# Patient Record
Sex: Female | Born: 1970 | Race: White | Hispanic: No | Marital: Married | Smoking: Never smoker
Health system: Southern US, Community
[De-identification: ages and names within clinical notes are randomized; demographics above are authoritative.]

## PROBLEM LIST (undated history)

## (undated) HISTORY — PX: TONSILLECTOMY: SUR1361

---

## 1998-05-25 ENCOUNTER — Other Ambulatory Visit: Admission: RE | Admit: 1998-05-25 | Discharge: 1998-05-25 | Payer: Self-pay | Admitting: Family Medicine

## 2005-03-28 ENCOUNTER — Other Ambulatory Visit: Admission: RE | Admit: 2005-03-28 | Discharge: 2005-03-28 | Payer: Self-pay | Admitting: Obstetrics and Gynecology

## 2005-03-29 ENCOUNTER — Ambulatory Visit (HOSPITAL_COMMUNITY): Admission: RE | Admit: 2005-03-29 | Discharge: 2005-03-29 | Payer: Self-pay | Admitting: Obstetrics and Gynecology

## 2006-05-23 ENCOUNTER — Other Ambulatory Visit: Admission: RE | Admit: 2006-05-23 | Discharge: 2006-05-23 | Payer: Self-pay | Admitting: Obstetrics and Gynecology

## 2016-05-18 ENCOUNTER — Other Ambulatory Visit (HOSPITAL_COMMUNITY): Payer: Self-pay | Admitting: Obstetrics and Gynecology

## 2016-05-18 DIAGNOSIS — Z1231 Encounter for screening mammogram for malignant neoplasm of breast: Secondary | ICD-10-CM

## 2016-05-21 ENCOUNTER — Ambulatory Visit (HOSPITAL_COMMUNITY)
Admission: RE | Admit: 2016-05-21 | Discharge: 2016-05-21 | Disposition: A | Discharge status: Home / Self Care | Payer: BC Managed Care – PPO | Source: Ambulatory Visit | Attending: Obstetrics and Gynecology | Admitting: Obstetrics and Gynecology

## 2016-05-21 DIAGNOSIS — R928 Other abnormal and inconclusive findings on diagnostic imaging of breast: Secondary | ICD-10-CM | POA: Diagnosis not present

## 2016-05-21 DIAGNOSIS — Z1231 Encounter for screening mammogram for malignant neoplasm of breast: Secondary | ICD-10-CM | POA: Diagnosis not present

## 2016-05-28 ENCOUNTER — Other Ambulatory Visit: Payer: Self-pay | Admitting: Obstetrics and Gynecology

## 2016-05-28 DIAGNOSIS — R928 Other abnormal and inconclusive findings on diagnostic imaging of breast: Secondary | ICD-10-CM

## 2016-06-05 ENCOUNTER — Other Ambulatory Visit: Payer: BC Managed Care – PPO

## 2016-06-12 ENCOUNTER — Encounter (HOSPITAL_COMMUNITY): Payer: BC Managed Care – PPO

## 2016-06-26 ENCOUNTER — Encounter (HOSPITAL_COMMUNITY): Payer: BC Managed Care – PPO

## 2017-06-27 ENCOUNTER — Other Ambulatory Visit: Payer: Self-pay | Admitting: Family Medicine

## 2017-06-27 DIAGNOSIS — N6320 Unspecified lump in the left breast, unspecified quadrant: Secondary | ICD-10-CM

## 2017-07-02 ENCOUNTER — Other Ambulatory Visit: Payer: BC Managed Care – PPO

## 2017-07-05 ENCOUNTER — Ambulatory Visit
Admission: RE | Admit: 2017-07-05 | Discharge: 2017-07-05 | Disposition: A | Discharge status: Home / Self Care | Payer: BC Managed Care – PPO | Source: Ambulatory Visit | Attending: Family Medicine | Admitting: Family Medicine

## 2017-07-05 ENCOUNTER — Other Ambulatory Visit: Payer: Self-pay | Admitting: Family Medicine

## 2017-07-05 DIAGNOSIS — N6002 Solitary cyst of left breast: Secondary | ICD-10-CM

## 2017-07-05 DIAGNOSIS — N6320 Unspecified lump in the left breast, unspecified quadrant: Secondary | ICD-10-CM

## 2018-07-07 ENCOUNTER — Other Ambulatory Visit: Payer: Self-pay | Admitting: Family Medicine

## 2018-07-07 DIAGNOSIS — Z1231 Encounter for screening mammogram for malignant neoplasm of breast: Secondary | ICD-10-CM

## 2018-07-09 ENCOUNTER — Ambulatory Visit
Admission: RE | Admit: 2018-07-09 | Discharge: 2018-07-09 | Disposition: A | Discharge status: Home / Self Care | Payer: BC Managed Care – PPO | Source: Ambulatory Visit | Attending: Family Medicine | Admitting: Family Medicine

## 2018-07-09 DIAGNOSIS — Z1231 Encounter for screening mammogram for malignant neoplasm of breast: Secondary | ICD-10-CM

## 2019-10-21 ENCOUNTER — Other Ambulatory Visit: Payer: Self-pay | Admitting: Family Medicine

## 2019-10-21 DIAGNOSIS — Z1231 Encounter for screening mammogram for malignant neoplasm of breast: Secondary | ICD-10-CM

## 2019-12-15 ENCOUNTER — Ambulatory Visit
Admission: RE | Admit: 2019-12-15 | Discharge: 2019-12-15 | Disposition: A | Discharge status: Home / Self Care | Payer: BC Managed Care – PPO | Source: Ambulatory Visit | Attending: Family Medicine | Admitting: Family Medicine

## 2019-12-15 ENCOUNTER — Other Ambulatory Visit: Payer: Self-pay

## 2019-12-15 DIAGNOSIS — Z1231 Encounter for screening mammogram for malignant neoplasm of breast: Secondary | ICD-10-CM

## 2020-01-18 ENCOUNTER — Ambulatory Visit: Payer: BC Managed Care – PPO | Attending: Family

## 2020-01-18 DIAGNOSIS — Z23 Encounter for immunization: Secondary | ICD-10-CM | POA: Insufficient documentation

## 2020-01-18 NOTE — Progress Notes (Signed)
   Covid-19 Vaccination Clinic  Name:  Erin Houston    MRN: [id number] DOB: [birth year]  01/18/2020  [name] was observed post Covid-19 immunization for 15 minutes without incidence. She was provided with Vaccine Information Sheet and instruction to access the V-Safe system.   [name] was instructed to call 911 with any severe reactions post vaccine: Marland Kitchen Difficulty breathing  . Swelling of your face and throat  . A fast heartbeat  . A bad rash all over your body  . Dizziness and weakness    Immunizations Administered    Name Date Dose VIS Date Route   Moderna COVID-19 Vaccine 01/18/2020  5:05 PM 0.5 mL 10/27/2019 Intramuscular   Manufacturer: Moderna   Lot: [id number]   [place]:[id number]

## 2020-03-08 ENCOUNTER — Ambulatory Visit: Payer: BC Managed Care – PPO

## 2021-02-26 IMAGING — MG DIGITAL SCREENING BILAT W/ TOMO W/ CAD
8 series · 8 of 24 positions shown · non-contrast
Comparison: Previous exam(s).

CLINICAL DATA: Screening.

EXAM:
DIGITAL SCREENING BILATERAL MAMMOGRAM WITH TOMO AND CAD

[L MLO synth-2D]
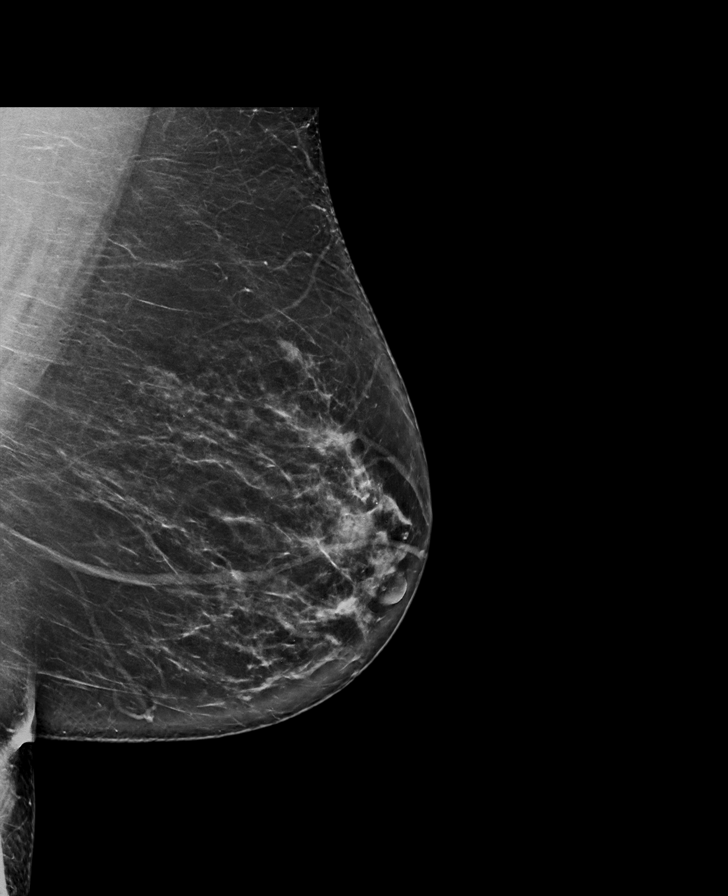

[R MLO synth-2D]
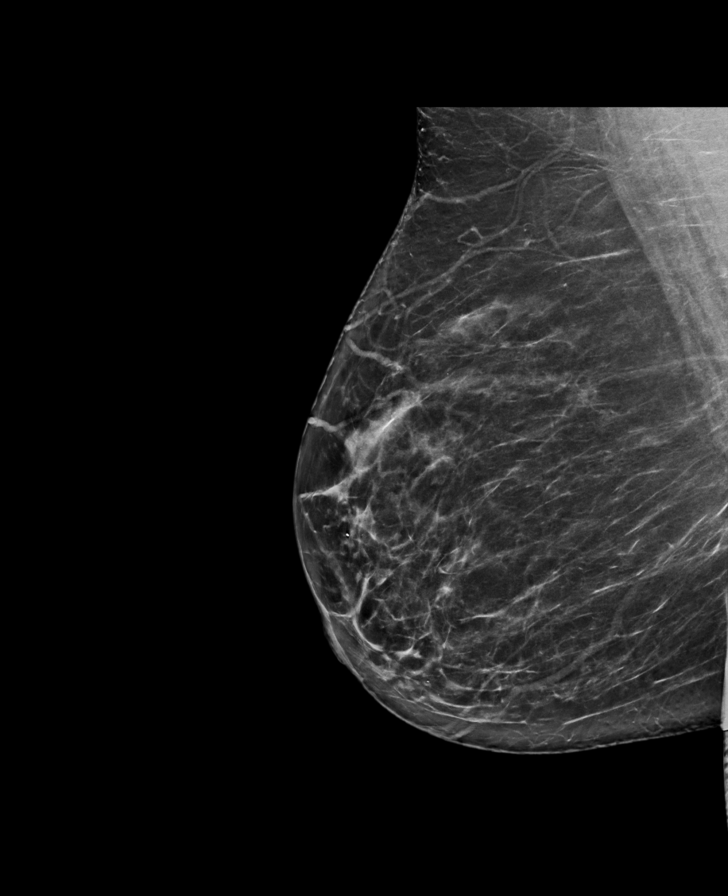

[R CC synth-2D]
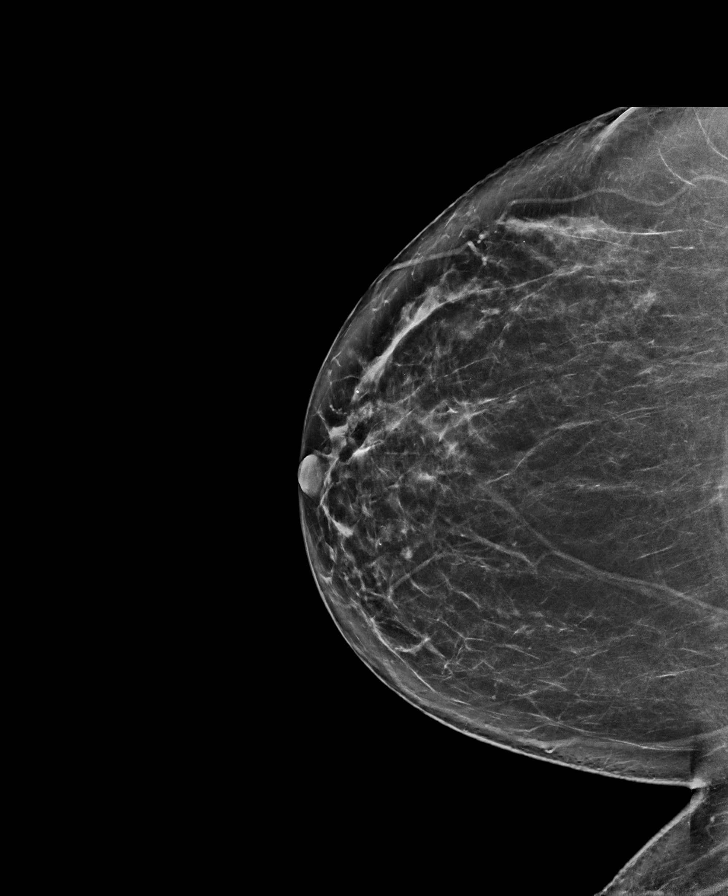

[L CC synth-2D]
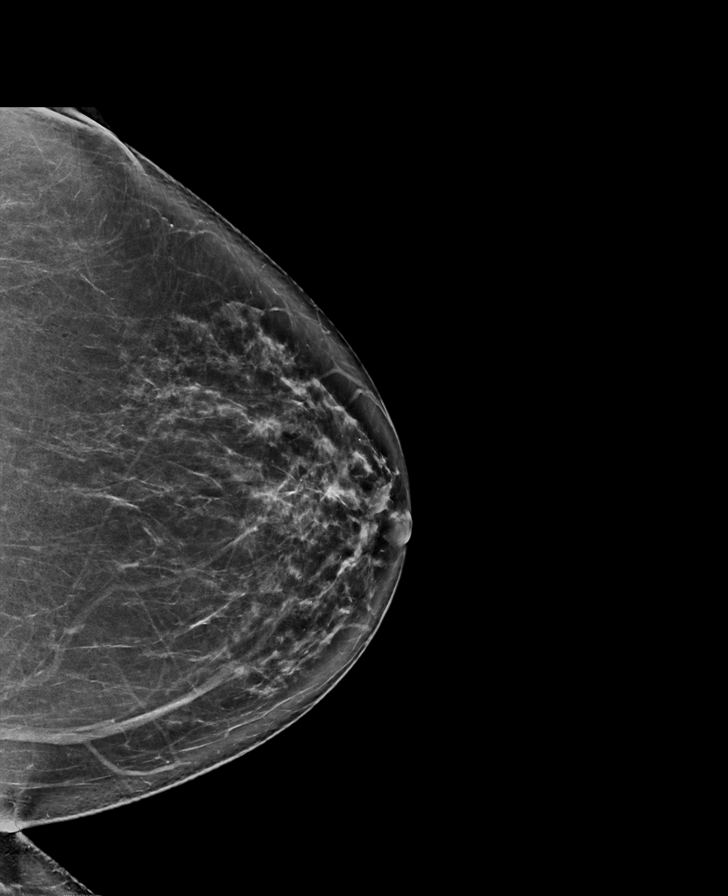

[L CC tomo · tomo slice 40/79.0]
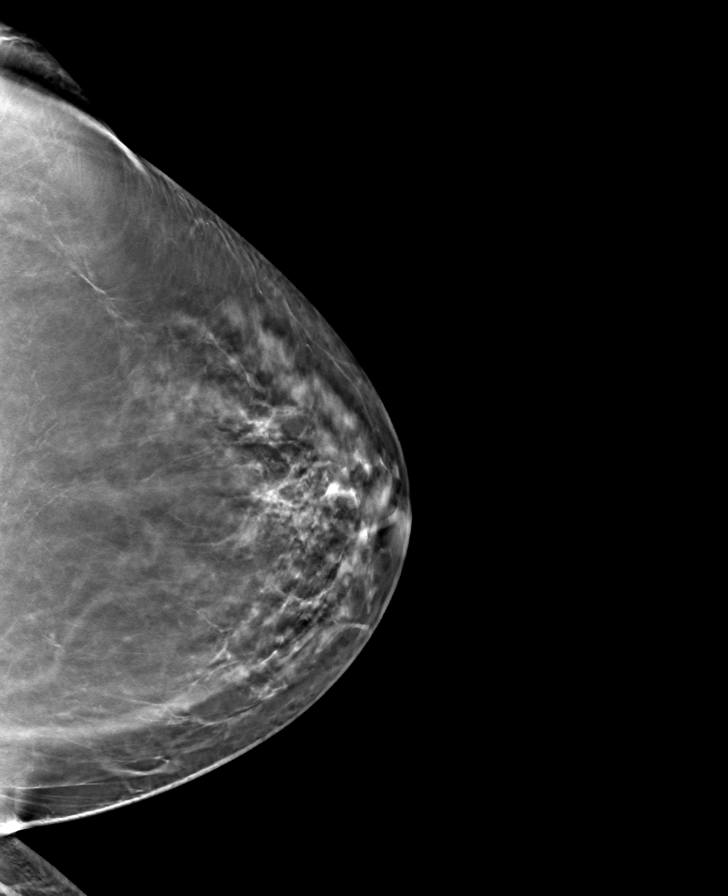

[R MLO tomo · tomo slice 41/81.0]
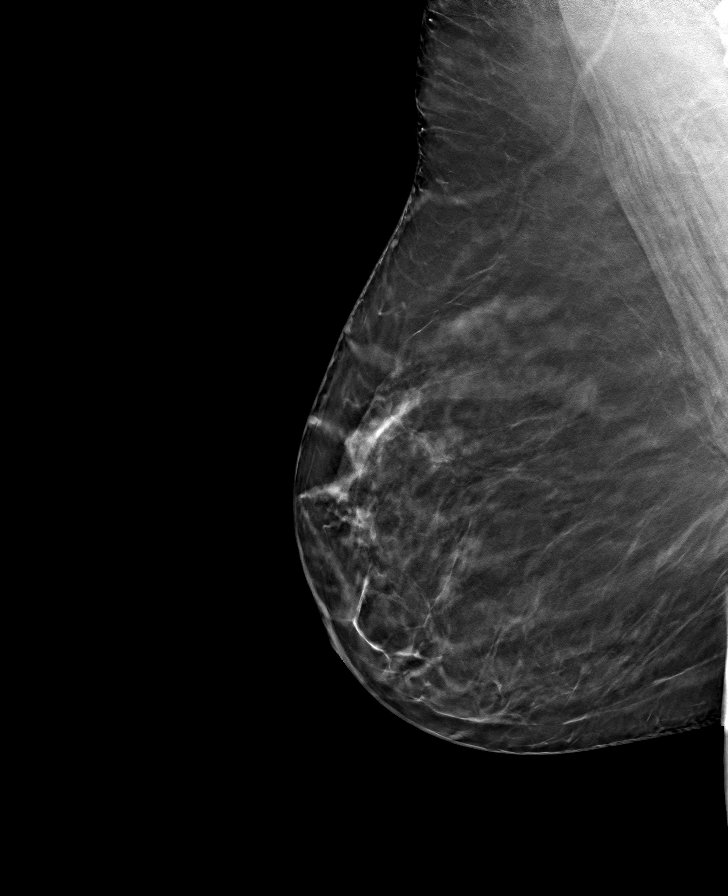

[L MLO tomo · tomo slice 41/82.0]
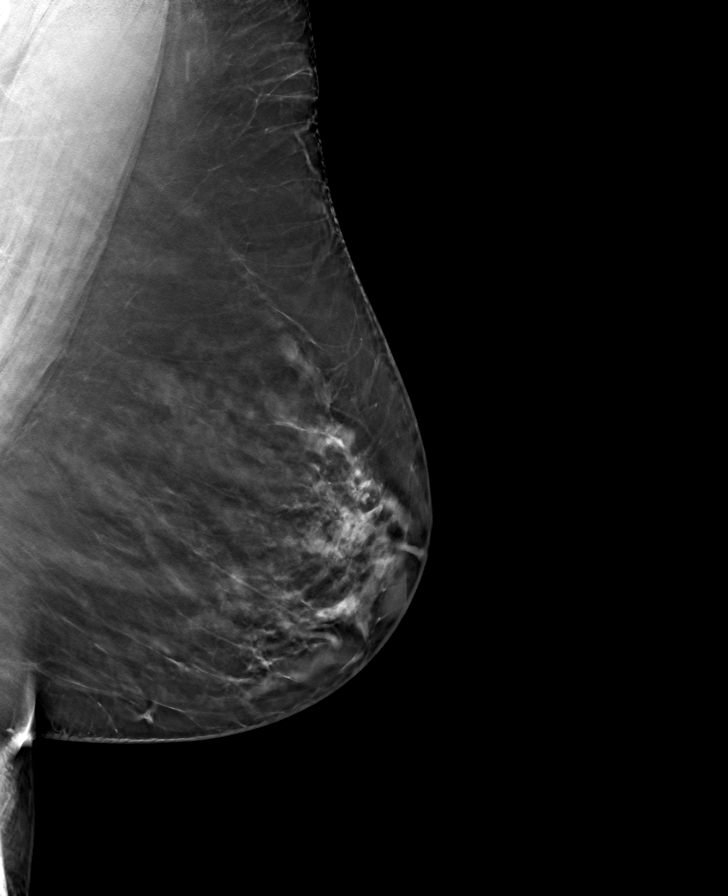

[R CC tomo · tomo slice 39/78.0]
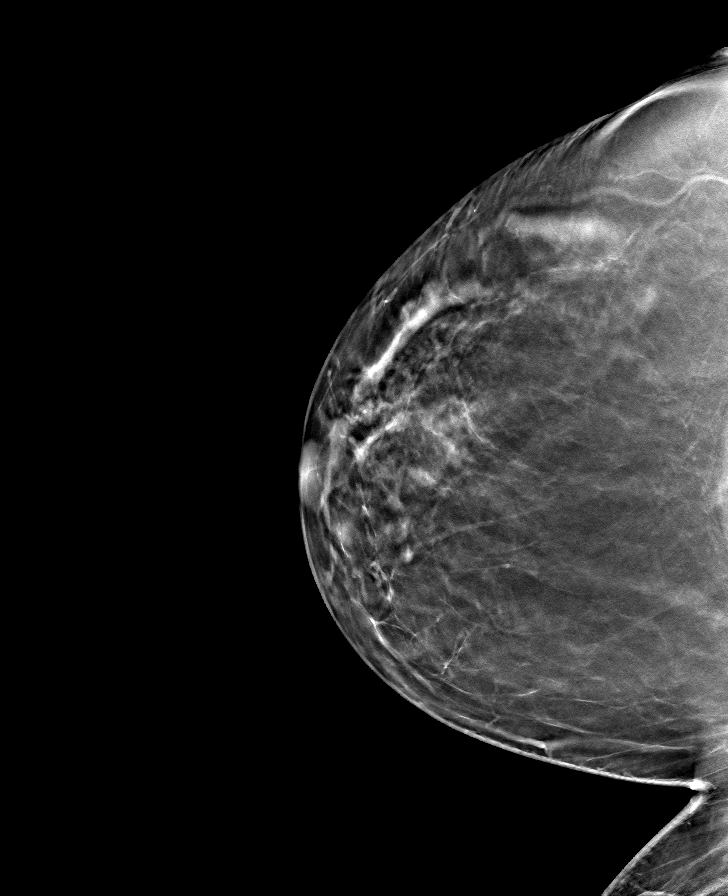

[8 of 24 positions shown; findings below may reference images not displayed]

ACR Breast Density Category b: There are scattered areas of
fibroglandular density.
FINDINGS: There are no findings suspicious for malignancy. Images were
processed with CAD.
IMPRESSION: No mammographic evidence of malignancy. A result letter of this
screening mammogram will be mailed directly to the patient.

RECOMMENDATION:
Screening mammogram in one year. (Code:[id number])

BI-RADS CATEGORY  1: Negative.

## 2021-10-04 ENCOUNTER — Inpatient Hospital Stay (HOSPITAL_BASED_OUTPATIENT_CLINIC_OR_DEPARTMENT_OTHER)
Admission: EM | Admit: 2021-10-04 | Discharge: 2021-10-06 | DRG: 391 | Disposition: A | Discharge status: Home / Self Care | Payer: BC Managed Care – PPO | Attending: Family Medicine | Admitting: Family Medicine

## 2021-10-04 ENCOUNTER — Encounter (HOSPITAL_BASED_OUTPATIENT_CLINIC_OR_DEPARTMENT_OTHER): Payer: Self-pay | Admitting: Obstetrics and Gynecology

## 2021-10-04 ENCOUNTER — Emergency Department (HOSPITAL_BASED_OUTPATIENT_CLINIC_OR_DEPARTMENT_OTHER): Payer: BC Managed Care – PPO

## 2021-10-04 ENCOUNTER — Other Ambulatory Visit: Payer: Self-pay

## 2021-10-04 DIAGNOSIS — Z20822 Contact with and (suspected) exposure to covid-19: Secondary | ICD-10-CM | POA: Diagnosis present

## 2021-10-04 DIAGNOSIS — K3533 Acute appendicitis with perforation and localized peritonitis, with abscess: Secondary | ICD-10-CM | POA: Diagnosis present

## 2021-10-04 DIAGNOSIS — R109 Unspecified abdominal pain: Secondary | ICD-10-CM | POA: Diagnosis present

## 2021-10-04 DIAGNOSIS — K523 Indeterminate colitis: Principal | ICD-10-CM | POA: Diagnosis present

## 2021-10-04 DIAGNOSIS — K649 Unspecified hemorrhoids: Secondary | ICD-10-CM | POA: Diagnosis present

## 2021-10-04 DIAGNOSIS — D122 Benign neoplasm of ascending colon: Secondary | ICD-10-CM | POA: Diagnosis not present

## 2021-10-04 DIAGNOSIS — K635 Polyp of colon: Secondary | ICD-10-CM | POA: Diagnosis present

## 2021-10-04 DIAGNOSIS — Z8379 Family history of other diseases of the digestive system: Secondary | ICD-10-CM | POA: Diagnosis not present

## 2021-10-04 DIAGNOSIS — R1033 Periumbilical pain: Secondary | ICD-10-CM

## 2021-10-04 DIAGNOSIS — R110 Nausea: Secondary | ICD-10-CM

## 2021-10-04 DIAGNOSIS — R933 Abnormal findings on diagnostic imaging of other parts of digestive tract: Secondary | ICD-10-CM

## 2021-10-04 DIAGNOSIS — D124 Benign neoplasm of descending colon: Secondary | ICD-10-CM | POA: Diagnosis not present

## 2021-10-04 DIAGNOSIS — R197 Diarrhea, unspecified: Secondary | ICD-10-CM

## 2021-10-04 LAB — COMPREHENSIVE METABOLIC PANEL
ALT: 14 U/L (ref 0–44)
AST: 15 U/L (ref 15–41)
Albumin: 3.9 g/dL (ref 3.5–5.0)
Alkaline Phosphatase: 59 U/L (ref 38–126)
Anion gap: 10 (ref 5–15)
BUN: 15 mg/dL (ref 6–20)
CO2: 27 mmol/L (ref 22–32)
Calcium: 9.8 mg/dL (ref 8.9–10.3)
Chloride: 99 mmol/L (ref 98–111)
Creatinine, Ser: 0.47 mg/dL (ref 0.44–1.00)
GFR, Estimated: >60 mL/min (ref >60)
Glucose, Bld: 92 mg/dL (ref 70–99)
Potassium: 3.8 mmol/L (ref 3.5–5.1)
Sodium: 136 mmol/L (ref 135–145)
Total Bilirubin: 0.5 mg/dL (ref 0.3–1.2)
Total Protein: 7.4 g/dL (ref 6.5–8.1)

## 2021-10-04 LAB — URINALYSIS, ROUTINE W REFLEX MICROSCOPIC
Bilirubin Urine: NEGATIVE
Glucose, UA: NEGATIVE mg/dL
Hgb urine dipstick: NEGATIVE
Ketones, ur: NEGATIVE mg/dL
Leukocytes,Ua: NEGATIVE
Nitrite: NEGATIVE
Protein, ur: NEGATIVE mg/dL
Specific Gravity, Urine: 1.011 (ref 1.005–1.030)
pH: 6 (ref 5.0–8.0)

## 2021-10-04 LAB — RESP PANEL BY RT-PCR (FLU A&B, COVID) ARPGX2
Influenza A by PCR: NEGATIVE
Influenza B by PCR: NEGATIVE
SARS Coronavirus 2 by RT PCR: NEGATIVE

## 2021-10-04 LAB — CBC
HCT: 39.3 % (ref 36.0–46.0)
Hemoglobin: 13.3 g/dL (ref 12.0–15.0)
MCH: 29 pg (ref 26.0–34.0)
MCHC: 33.8 g/dL (ref 30.0–36.0)
MCV: 85.6 fL (ref 80.0–100.0)
Platelets: 252 10*3/uL (ref 150–400)
RBC: 4.59 MIL/uL (ref 3.87–5.11)
RDW: 13.8 % (ref 11.5–15.5)
WBC: 10 10*3/uL (ref 4.0–10.5)
nRBC: 0 % (ref 0.0–0.2)

## 2021-10-04 LAB — LIPASE, BLOOD: Lipase: <10 U/L — ABNORMAL LOW (ref 11–51)

## 2021-10-04 LAB — PREGNANCY, URINE: Preg Test, Ur: NEGATIVE

## 2021-10-04 MED ORDER — MORPHINE SULFATE (PF) 2 MG/ML IV SOLN: 1.0000 mg | INTRAVENOUS | Status: DC | PRN

## 2021-10-04 MED ORDER — IOHEXOL 300 MG/ML  SOLN
100.0000 mL | Freq: Once | INTRAMUSCULAR | Status: AC | PRN
  Administered 2021-10-04: 100 mL via INTRAVENOUS

## 2021-10-04 MED ORDER — DICYCLOMINE HCL 10 MG PO CAPS
10.0000 mg | ORAL_CAPSULE | Freq: Once | ORAL | Status: AC
  Administered 2021-10-04: 10 mg via ORAL
  Filled 2021-10-04: qty 1

## 2021-10-04 MED ORDER — SODIUM CHLORIDE 0.9 % IV BOLUS
1000.0000 mL | Freq: Once | INTRAVENOUS | Status: AC
  Administered 2021-10-04: 1000 mL via INTRAVENOUS

## 2021-10-04 MED ORDER — ACETAMINOPHEN 650 MG RE SUPP: 650.0000 mg | Freq: Four times a day (QID) | RECTAL | Status: DC | PRN

## 2021-10-04 MED ORDER — ACETAMINOPHEN 325 MG PO TABS: 650.0000 mg | ORAL_TABLET | Freq: Four times a day (QID) | ORAL | Status: DC | PRN

## 2021-10-04 MED ORDER — DEXTROSE IN LACTATED RINGERS 5 % IV SOLN: INTRAVENOUS | Status: AC

## 2021-10-04 MED ORDER — PIPERACILLIN-TAZOBACTAM 3.375 G IVPB 30 MIN
3.3750 g | Freq: Three times a day (TID) | INTRAVENOUS | Status: DC
Start: 2021-10-05 — End: 2021-10-06
  Administered 2021-10-05 – 2021-10-06 (×5): 3.375 g via INTRAVENOUS
  Filled 2021-10-04 (×6): qty 50

## 2021-10-04 MED ORDER — PIPERACILLIN-TAZOBACTAM 3.375 G IVPB 30 MIN
3.3750 g | Freq: Once | INTRAVENOUS | Status: AC
  Administered 2021-10-04: 3.375 g via INTRAVENOUS
  Filled 2021-10-04: qty 50

## 2021-10-04 NOTE — ED Provider Notes (Signed)
[facility] EMERGENCY DEPT Provider Note   CSN: [id number] Arrival date & time: 10/04/21  1448     History Chief Complaint  Patient presents with   Abdominal Pain    Erin Houston is a 50 y.o. female.  Who presents to the emergency department with abdominal pain.  She states that beginning on Saturday night she started having abdominal discomfort and "spasming."  She describes the pain as constant, spasmy, and centered around her navel.  She also subjectively feels bloated and distended.  She had associated nausea, vomiting and diarrhea.  Denies blood in her vomit or diarrhea.  She states that she took some "gas medication" with minimal relief of symptoms.  She denies having excessive flatulence.  She initially had decreased p.o. intake but has been tolerating food and water as of last night.  She denies fevers, urinary symptoms, sick contacts.  She states that celiac disease runs in her family and she has been attempting to stay away from gluten.  She does not have a diagnosis of celiac disease.  She states that this is happened twice before and has had similar symptoms however they have not lasted this long.   Abdominal Pain Associated symptoms: diarrhea, nausea and vomiting   Associated symptoms: no dysuria and no fever       History reviewed. No pertinent past medical history.  There are no problems to display for this patient.   History reviewed. No pertinent surgical history.   OB History     Gravida      Para      Term      Preterm      AB      Living  1      SAB      IAB      Ectopic      Multiple      Live Births              No family history on file.  Social History   Tobacco Use   Smoking status: Never    Passive exposure: Never   Smokeless tobacco: Never  Vaping Use   Vaping Use: Never used  Substance Use Topics   Alcohol use: Not Currently   Drug use: Not Currently    Home Medications Prior to Admission  medications   Not on File    Allergies    Patient has no known allergies.  Review of Systems   Review of Systems  Constitutional:  Positive for appetite change. Negative for fever.  Gastrointestinal:  Positive for abdominal pain, diarrhea, nausea and vomiting. Negative for blood in stool.  Genitourinary:  Negative for dysuria.  All other systems reviewed and are negative.  Physical Exam Updated Vital Signs BP 116/77 (BP Location: Right Arm)   Pulse (!) 113   Temp 98.9 F (37.2 C)   Resp 18   Ht 5\' 6"  (1.676 m)   Wt 81.2 kg   LMP 02/28/2021   SpO2 98%   BMI 28.89 kg/m   Physical Exam Vitals and nursing note reviewed.  Constitutional:      General: She is not in acute distress.    Appearance: Normal appearance. She is well-developed. She is not ill-appearing or toxic-appearing.  HENT:     Head: Normocephalic and atraumatic.     Mouth/Throat:     Mouth: Mucous membranes are moist.  Eyes:     General: No scleral icterus.    Extraocular Movements: Extraocular movements intact.  Cardiovascular:     Rate and Rhythm: Normal rate and regular rhythm.     Heart sounds: Normal heart sounds. No murmur heard. Pulmonary:     Effort: Pulmonary effort is normal. No respiratory distress.     Breath sounds: Normal breath sounds.  Abdominal:     General: Abdomen is protuberant. Bowel sounds are normal.     Palpations: Abdomen is soft.     Tenderness: There is abdominal tenderness in the periumbilical area. There is no right CVA tenderness, left CVA tenderness or rebound. Negative signs include Murphy's sign and McBurney's sign.     Comments: She endorses distention however there is no obvious abdominal distention or rigidity  Skin:    General: Skin is warm and dry.     Capillary Refill: Capillary refill takes less than 2 seconds.  Neurological:     General: No focal deficit present.     Mental Status: She is alert and oriented to person, place, and time.  Psychiatric:         Mood and Affect: Mood normal.        Behavior: Behavior normal.    ED Results / Procedures / Treatments   Labs (all labs ordered are listed, but only abnormal results are displayed) Labs Reviewed  LIPASE, BLOOD - Abnormal; Notable for the following components:      Result Value   Lipase <10 (*)    All other components within normal limits  COMPREHENSIVE METABOLIC PANEL  CBC  URINALYSIS, ROUTINE W REFLEX MICROSCOPIC  PREGNANCY, URINE   EKG None  Radiology CT Abdomen Pelvis W Contrast  Result Date: 10/04/2021 CLINICAL DATA:  Abdominal distension, severe diffuse abdominal pain and gas like feelings, thinks she has an undiagnosed gluten sensitivity and had a donut Saturday night EXAM: CT ABDOMEN AND PELVIS WITH CONTRAST TECHNIQUE: Multidetector CT imaging of the abdomen and pelvis was performed using the standard protocol following bolus administration of intravenous contrast. CONTRAST:  118mL OMNIPAQUE IOHEXOL 300 MG/ML SOLN IV. No oral contrast. COMPARISON:  None FINDINGS: Lower chest: Lung bases clear Hepatobiliary: Gallbladder and liver normal appearance Pancreas: Normal appearance Spleen: Normal appearance Adrenals/Urinary Tract: Adrenal glands, kidneys, ureters, and bladder normal appearance Stomach/Bowel: Appendix enlarged, 9 mm thick though mildly tapering distally. Extensive inflammatory process identified in the RIGHT pelvis with diffuse infiltration of mesenteric planes, thickening of cecum, periappendiceal infiltration, and thickening of distal small bowel loops. Associated free fluid in pelvis and mesentery. Dilated proximal small bowel loops some which show upper normal wall thickness. Colon decompressed. Stomach unremarkable. Ingested radiopacities within colon. Vascular/Lymphatic: Vascular structures patent. Aorta normal caliber. Scattered normal size mesenteric lymph nodes. Reproductive: Unremarkable uterus and adnexa Other: Free fluid in pelvis.  No free air.  No hernia or  abscess. Musculoskeletal: No acute osseous findings. IMPRESSION: Extensive inflammatory process in the RIGHT pelvis with diffuse infiltration of mesenteric planes, thickening of cecum, and distal small bowel loops, associated with free fluid in pelvis and mesentery. The appendix is mildly enlarged and does demonstrate a degree of peri-appendiceal infiltration, though it is uncertain whether this is the epicenter of the process or secondarily involved. Findings favor acute appendicitis with associated peritonitis and wall thickening of small bowel loops in the pelvis versus less likely diffuse enteritis of the distal small bowel loops such as from inflammatory bowel disease or infectious etiology with associated regional inflammatory changes. Secondary ileus with dilatation of proximal small bowel. Free fluid without free air or definite abscess. Electronically Signed   By: Elta Guadeloupe  Thornton Papas M.D.   On: 10/04/2021 17:36    Procedures Procedures   Medications Ordered in ED Medications  dicyclomine (BENTYL) capsule 10 mg (10 mg Oral Given 10/04/21 1632)  iohexol (OMNIPAQUE) 300 MG/ML solution 100 mL (100 mLs Intravenous Contrast Given 10/04/21 1708)  piperacillin-tazobactam (ZOSYN) IVPB 3.375 g (0 g Intravenous Stopped 10/04/21 1839)  sodium chloride 0.9 % bolus 1,000 mL (1,000 mLs Intravenous New Bag/Given 10/04/21 1809)   ED Course  I have reviewed the triage vital signs and the nursing notes.  Pertinent labs & imaging results that were available during my care of the patient were reviewed by me and considered in my medical decision making (see chart for details).    MDM Rules/Calculators/A&P 50 year old female who presents emergency department abdominal pain.  Labs unremarkable  Given Bentyl for symptomatic relief of symptoms  CT A/P demonstrates extensive inflammatory process in right pelvis. Findings favoring acute appendicitis with peritonitis and wall thickening of small bowel loops.  -Spoke with  Dr. Dema Severin with general surgery who requested the patient be ED to ED transfer to [facility] so that she can be evaluated by the surgery team.  Otherwise she still needs to be admitted for antibiotics and observation.  We will can coordinate this. -Empirically started on Zosyn given 1 L fluid bolus.  Abdominal exam without peritoneal signs. No evidence of acute abdomen at this time. Well appearing.  She is stable to be transferred over to [facility] ED. Spoke with Dr. Doren Custard, ED physician at [facility] ED who accepts the patient. Final Clinical Impression(s) / ED Diagnoses Final diagnoses:  Periumbilical abdominal pain    Rx / DC Orders ED Discharge Orders     None        Mickie Hillier, PA-C 10/04/21 1909    Gareth Morgan, MD 10/06/21 1353

## 2021-10-04 NOTE — Consult Note (Signed)
CC: RLQ pain  Requesting provider: Dr. Deno Etienne  HPI: Erin Houston is an 50 y.o. female with hx of ?gluten sensitivity whom presented to [facility] with a now 5-day history of right lower quadrant abdominal pain.  She reports this pain began Friday or Saturday and has been a vague crampy/bloating sensation.  No intense or severe right lower quadrant pain per se.  She denies any history of fevers or emesis.  She has had intermittent nausea.  Today, she reports she had a great appetite and had a large lunch to eat.  She has been having diarrhea this entire time.  She denies any blood in her stool.  She denies any weight changes.  Her symptoms do not seem to be helped or worsened by any particular triggers.  She does report that over the last year she has had 2 other attacks of this same kind of constellation of symptoms.  She reports that she has tried to get in multiple times to see [facility] as she has been concerned about her symptoms.  She reports that she was told she needed a referral from her primary care before she could be seen.   Currently, reports mild right lower quadrant pain.    No prior colonoscopy  No known hx of IBD; multiple family members with Celiac  She works for [employer] as a Pharmacist, hospital in the gifted student program  History reviewed. No pertinent past medical history.  History reviewed. No pertinent surgical history.  No family history on file.  Social:  reports that she has never smoked. She has never been exposed to tobacco smoke. She has never used smokeless tobacco. She reports that she does not currently use alcohol. She reports that she does not currently use drugs.  Allergies: No Known Allergies  Medications: I have reviewed the patient's current medications.  Results for orders placed or performed during the hospital encounter of 10/04/21 (from the past 48 hour(s))  Urinalysis, Routine w reflex microscopic Urine, Clean Catch      Status: None   Collection Time: 10/04/21  3:29 PM  Result Value Ref Range   Color, Urine YELLOW YELLOW   APPearance CLEAR CLEAR   Specific Gravity, Urine 1.011 1.005 - 1.030   pH 6.0 5.0 - 8.0   Glucose, UA NEGATIVE NEGATIVE mg/dL   Hgb urine dipstick NEGATIVE NEGATIVE   Bilirubin Urine NEGATIVE NEGATIVE   Ketones, ur NEGATIVE NEGATIVE mg/dL   Protein, ur NEGATIVE NEGATIVE mg/dL   Nitrite NEGATIVE NEGATIVE   Leukocytes,Ua NEGATIVE NEGATIVE    Comment: Performed at [facility], [address], [place], [place] [address]  Pregnancy, urine     Status: None   Collection Time: 10/04/21  3:29 PM  Result Value Ref Range   Preg Test, Ur NEGATIVE NEGATIVE    Comment:        THE SENSITIVITY OF THIS METHODOLOGY IS >20 mIU/mL. Performed at [facility], [address] [place], [place] [address]   Lipase, blood     Status: Abnormal   Collection Time: 10/04/21  3:54 PM  Result Value Ref Range   Lipase <10 (L) 11 - 51 U/L    Comment: Performed at [facility], [address], [place], [place] [address]  Comprehensive metabolic panel     Status: None   Collection Time: 10/04/21  3:54 PM  Result Value Ref Range   Sodium 136 135 - 145 mmol/L   Potassium  3.8 3.5 - 5.1 mmol/L   Chloride 99 98 - 111 mmol/L   CO2 27 22 - 32 mmol/L   Glucose, Bld 92 70 - 99 mg/dL    Comment: Glucose reference range applies only to samples taken after fasting for at least 8 hours.   BUN 15 6 - 20 mg/dL   Creatinine, Ser 0.47 0.44 - 1.00 mg/dL   Calcium 9.8 8.9 - 10.3 mg/dL   Total Protein 7.4 6.5 - 8.1 g/dL   Albumin 3.9 3.5 - 5.0 g/dL   AST 15 15 - 41 U/L   ALT 14 0 - 44 U/L   Alkaline Phosphatase 59 38 - 126 U/L   Total Bilirubin 0.5 0.3 - 1.2 mg/dL   GFR, Estimated >60 >60 mL/min    Comment: (NOTE) Calculated using the CKD-EPI Creatinine Equation (2021)    Anion gap 10 5 - 15    Comment: Performed at [facility], [address] [place], [place] [address]  CBC     Status: None   Collection Time: 10/04/21  3:54 PM  Result Value Ref Range   WBC 10.0 4.0 - 10.5 K/uL   RBC 4.59 3.87 - 5.11 MIL/uL   Hemoglobin 13.3 12.0 - 15.0 g/dL   HCT 39.3 36.0 - 46.0 %   MCV 85.6 80.0 - 100.0 fL   MCH 29.0 26.0 - 34.0 pg   MCHC 33.8 30.0 - 36.0 g/dL   RDW 13.8 11.5 - 15.5 %   Platelets 252 150 - 400 K/uL   nRBC 0.0 0.0 - 0.2 %    Comment: Performed at [facility], [address], [place], [place] [address]    CT Abdomen Pelvis W Contrast  Result Date: 10/04/2021 CLINICAL DATA:  Abdominal distension, severe diffuse abdominal pain and gas like feelings, thinks she has an undiagnosed gluten sensitivity and had a donut Saturday night EXAM: CT ABDOMEN AND PELVIS WITH CONTRAST TECHNIQUE: Multidetector CT imaging of the abdomen and pelvis was performed using the standard protocol following bolus administration of intravenous contrast. CONTRAST:  164mL OMNIPAQUE IOHEXOL 300 MG/ML SOLN IV. No oral contrast. COMPARISON:  None FINDINGS: Lower chest: Lung bases clear Hepatobiliary: Gallbladder and liver normal appearance Pancreas: Normal appearance Spleen: Normal appearance Adrenals/Urinary Tract: Adrenal glands, kidneys, ureters, and bladder normal appearance Stomach/Bowel: Appendix enlarged, 9 mm thick though mildly tapering distally. Extensive inflammatory process identified in the RIGHT pelvis with diffuse infiltration of mesenteric planes, thickening of cecum, periappendiceal infiltration, and thickening of distal small bowel loops. Associated free fluid in pelvis and mesentery. Dilated proximal small bowel loops some which show upper normal wall thickness. Colon decompressed. Stomach unremarkable. Ingested radiopacities within colon. Vascular/Lymphatic: Vascular structures patent. Aorta normal caliber. Scattered normal size mesenteric lymph nodes. Reproductive: Unremarkable uterus and adnexa Other: Free fluid  in pelvis.  No free air.  No hernia or abscess. Musculoskeletal: No acute osseous findings. IMPRESSION: Extensive inflammatory process in the RIGHT pelvis with diffuse infiltration of mesenteric planes, thickening of cecum, and distal small bowel loops, associated with free fluid in pelvis and mesentery. The appendix is mildly enlarged and does demonstrate a degree of peri-appendiceal infiltration, though it is uncertain whether this is the epicenter of the process or secondarily involved. Findings favor acute appendicitis with associated peritonitis and wall thickening of small bowel loops in the pelvis versus less likely diffuse enteritis of the distal small bowel loops such as from inflammatory bowel disease or infectious etiology with associated regional inflammatory changes. Secondary ileus with  dilatation of proximal small bowel. Free fluid without free air or definite abscess. Electronically Signed   By: Lavonia Dana M.D.   On: 10/04/2021 17:36    ROS - all of the below systems have been reviewed with the patient and positives are indicated with bold text General: chills, fever or night sweats Eyes: blurry vision or double vision ENT: epistaxis or sore throat Allergy/Immunology: itchy/watery eyes or nasal congestion Hematologic/Lymphatic: bleeding problems, blood clots or swollen lymph nodes Endocrine: temperature intolerance or unexpected weight changes Breast: new or changing breast lumps or nipple discharge Resp: cough, shortness of breath, or wheezing CV: chest pain or dyspnea on exertion GI: as per HPI GU: dysuria, trouble voiding, or hematuria MSK: joint pain or joint stiffness Neuro: TIA or stroke symptoms Derm: pruritus and skin lesion changes Psych: anxiety and depression  PE Blood pressure 125/79, pulse 94, temperature 98.2 F (36.8 C), temperature source Oral, resp. rate 14, height 5\' 6"  (1.676 m), weight 81.2 kg, last menstrual period 02/28/2021, SpO2 98 %. Constitutional:  NAD; conversant; no deformities; clinically well appearing Eyes: Moist conjunctiva; no lid lag; anicteric; PERRL Neck: Trachea midline; no thyromegaly Lungs: Normal respiratory effort; no tactile fremitus CV: RRR; no palpable thrills; no pitting edema GI: Abdomen is soft, minimal RLQ tenderness, no rebound nor guarding, nondistended; no palpable hepatosplenomegaly MSK: Normal range of motion of extremities; no clubbing/cyanosis Psychiatric: Appropriate affect; alert and oriented x3 Lymphatic: No palpable cervical or axillary lymphadenopathy  Results for orders placed or performed during the hospital encounter of 10/04/21 (from the past 48 hour(s))  Urinalysis, Routine w reflex microscopic Urine, Clean Catch     Status: None   Collection Time: 10/04/21  3:29 PM  Result Value Ref Range   Color, Urine YELLOW YELLOW   APPearance CLEAR CLEAR   Specific Gravity, Urine 1.011 1.005 - 1.030   pH 6.0 5.0 - 8.0   Glucose, UA NEGATIVE NEGATIVE mg/dL   Hgb urine dipstick NEGATIVE NEGATIVE   Bilirubin Urine NEGATIVE NEGATIVE   Ketones, ur NEGATIVE NEGATIVE mg/dL   Protein, ur NEGATIVE NEGATIVE mg/dL   Nitrite NEGATIVE NEGATIVE   Leukocytes,Ua NEGATIVE NEGATIVE    Comment: Performed at [facility], [address] [place], [place] [address]  Pregnancy, urine     Status: None   Collection Time: 10/04/21  3:29 PM  Result Value Ref Range   Preg Test, Ur NEGATIVE NEGATIVE    Comment:        THE SENSITIVITY OF THIS METHODOLOGY IS >20 mIU/mL. Performed at [facility], [address] [place], [place] [address]   Lipase, blood     Status: Abnormal   Collection Time: 10/04/21  3:54 PM  Result Value Ref Range   Lipase <10 (L) 11 - 51 U/L    Comment: Performed at [facility], [address], [place], [place] [address]  Comprehensive metabolic panel     Status: None   Collection Time: 10/04/21  3:54 PM  Result Value Ref Range   Sodium  136 135 - 145 mmol/L   Potassium 3.8 3.5 - 5.1 mmol/L   Chloride 99 98 - 111 mmol/L   CO2 27 22 - 32 mmol/L   Glucose, Bld 92 70 - 99 mg/dL    Comment: Glucose reference range applies only to samples taken after fasting for at least 8 hours.   BUN 15 6 - 20 mg/dL   Creatinine, Ser 0.47 0.44 - 1.00 mg/dL   Calcium 9.8 8.9 - 10.3  mg/dL   Total Protein 7.4 6.5 - 8.1 g/dL   Albumin 3.9 3.5 - 5.0 g/dL   AST 15 15 - 41 U/L   ALT 14 0 - 44 U/L   Alkaline Phosphatase 59 38 - 126 U/L   Total Bilirubin 0.5 0.3 - 1.2 mg/dL   GFR, Estimated >60 >60 mL/min    Comment: (NOTE) Calculated using the CKD-EPI Creatinine Equation (2021)    Anion gap 10 5 - 15    Comment: Performed at [facility], [address] [place], [place] [address]  CBC     Status: None   Collection Time: 10/04/21  3:54 PM  Result Value Ref Range   WBC 10.0 4.0 - 10.5 K/uL   RBC 4.59 3.87 - 5.11 MIL/uL   Hemoglobin 13.3 12.0 - 15.0 g/dL   HCT 39.3 36.0 - 46.0 %   MCV 85.6 80.0 - 100.0 fL   MCH 29.0 26.0 - 34.0 pg   MCHC 33.8 30.0 - 36.0 g/dL   RDW 13.8 11.5 - 15.5 %   Platelets 252 150 - 400 K/uL   nRBC 0.0 0.0 - 0.2 %    Comment: Performed at [facility], [address], [place], [place] [address]    CT Abdomen Pelvis W Contrast  Result Date: 10/04/2021 CLINICAL DATA:  Abdominal distension, severe diffuse abdominal pain and gas like feelings, thinks she has an undiagnosed gluten sensitivity and had a donut Saturday night EXAM: CT ABDOMEN AND PELVIS WITH CONTRAST TECHNIQUE: Multidetector CT imaging of the abdomen and pelvis was performed using the standard protocol following bolus administration of intravenous contrast. CONTRAST:  148mL OMNIPAQUE IOHEXOL 300 MG/ML SOLN IV. No oral contrast. COMPARISON:  None FINDINGS: Lower chest: Lung bases clear Hepatobiliary: Gallbladder and liver normal appearance Pancreas: Normal appearance Spleen: Normal appearance Adrenals/Urinary Tract:  Adrenal glands, kidneys, ureters, and bladder normal appearance Stomach/Bowel: Appendix enlarged, 9 mm thick though mildly tapering distally. Extensive inflammatory process identified in the RIGHT pelvis with diffuse infiltration of mesenteric planes, thickening of cecum, periappendiceal infiltration, and thickening of distal small bowel loops. Associated free fluid in pelvis and mesentery. Dilated proximal small bowel loops some which show upper normal wall thickness. Colon decompressed. Stomach unremarkable. Ingested radiopacities within colon. Vascular/Lymphatic: Vascular structures patent. Aorta normal caliber. Scattered normal size mesenteric lymph nodes. Reproductive: Unremarkable uterus and adnexa Other: Free fluid in pelvis.  No free air.  No hernia or abscess. Musculoskeletal: No acute osseous findings. IMPRESSION: Extensive inflammatory process in the RIGHT pelvis with diffuse infiltration of mesenteric planes, thickening of cecum, and distal small bowel loops, associated with free fluid in pelvis and mesentery. The appendix is mildly enlarged and does demonstrate a degree of peri-appendiceal infiltration, though it is uncertain whether this is the epicenter of the process or secondarily involved. Findings favor acute appendicitis with associated peritonitis and wall thickening of small bowel loops in the pelvis versus less likely diffuse enteritis of the distal small bowel loops such as from inflammatory bowel disease or infectious etiology with associated regional inflammatory changes. Secondary ileus with dilatation of proximal small bowel. Free fluid without free air or definite abscess. Electronically Signed   By: Lavonia Dana M.D.   On: 10/04/2021 17:36    I have personally reviewed her CT scan. I also discussed the imaging with our on call CT body radiologist  Dr. Merry Proud , as there is concern for my perspective that this does not appear to be classic appendicitis.  He agreed this was  much more  concerning for an inflammatory process like IBD/Crohn's disease.  A/P: Erin Houston is an 50 y.o. female with acute inflammatory process in her right lower quadrant  -Constellation of imaging findings appears much more suspicious to me to be something such as Crohn's disease.  She has had 2 prior attacks this year similar in presentation but without being able to be seen by a doctor at those times.  Clinically appears well... is afebrile with normal WBC.  She has had no fevers and has a rather benign exam in light of her radiographic findings, significantly lower my suspicion for 'perforated appendicitis.' -Agree with admission for further monitoring -Would empirically cover with broad-spectrum IV antibiotics -GI consultation for colonoscopy -Would keep n.p.o. for time being pending GI evaluation -We will follow with you -We spent time going over all the above with her and her husband who was at bedside in the ER.  We discussed these plans moving forward.  Their questions were answered.  They are in agreement with the plan  Nadeen Landau, MD [facility] Use [email or web address] to contact on call provider

## 2021-10-04 NOTE — Progress Notes (Signed)
Pharmacy Antibiotic Note  Erin Houston is a 50 y.o. female admitted on 10/04/2021 with  IAI .  Pharmacy has been consulted for zosyn dosing.  Plan: Zosyn 3.375g IV q8h (4 hour infusion). -Monitor renal function, clinical status, and antibiotic plan  Height: 5\' 6"  (167.6 cm) Weight: 81.2 kg (179 lb) IBW/kg (Calculated) : 59.3  Temp (24hrs), Avg:98.6 F (37 C), Min:98.2 F (36.8 C), Max:98.9 F (37.2 C)  Recent Labs  Lab 10/04/21 1554  WBC 10.0  CREATININE 0.47    Estimated Creatinine Clearance: 89.4 mL/min (by C-G formula based on SCr of 0.47 mg/dL).    No Known Allergies  Joetta Manners, PharmD, [facility] Emergency Medicine Clinical Pharmacist ED RPh Phone: [place]: [phone]

## 2021-10-04 NOTE — ED Notes (Signed)
Patient transported to CT 

## 2021-10-04 NOTE — ED Notes (Signed)
Last meal 1100 today; last drank prior to bentyl was water at 1445.

## 2021-10-04 NOTE — ED Provider Notes (Signed)
Patient arrived in transfer from [facility].  Briefly having lower abdominal discomfort some concern for appendicitis transferred here for surgical consult.  Seen by Dr. Dema Severin, general surgery felt most likely to be Crohn's disease.  Recommended medical admission and likely will need GI consult.     Deno Etienne, DO 10/04/21 2020

## 2021-10-04 NOTE — ED Notes (Signed)
ED Provider at bedside. 

## 2021-10-04 NOTE — H&P (Signed)
History and Physical    Erin Houston:[id number] DOB: 18-Jan-1971 DOA: 10/04/2021  PCP: Nickola Major, MD  Patient coming from: Home.  Chief Complaint: Abdominal pain.  HPI: Erin Houston is a 50 y.o. female with no significant past medical history presents to the ER with complaints of abdominal pain.  Patient has been having abdominal discomfort for the last 5 days with nausea and also had some vomiting.  Had at least 4 episodes of watery diarrhea over the last 5 days.  Patient had some subjective feeling of fever chills.  Denies any joint pains or skin rashes.  Patient states her symptoms started after she took her daughter.  She thought she may be having celiac disease and has been trying to avoid gluten diet.  Pain initially started in the epigastric area became more diffuse and is more concentrated periumbilically.  Pain also was more acutely worsened on the right lower quadrant.  Over the last 5 days patient has been eating very little but today she did have a full lunch with chicken.  Since pain was worsening patient came to the ER.  Patient had a similar episode about 2 months ago but did not last this long.  Also had a similar episode in April of this year.  Has not sought any medical intervention during the last 2 episodes.  Has never had any colonoscopy.  Does not take any medications.  ED Course: In the ER CT scan was showing features concerning for possible acute appendicitis.  ER physician consulted on-call general surgeon Dr. Dema Severin by this time finds the constellation of findings are more concerning for inflammatory bowel disease.  Empiric antibiotics was started IV fluids pain relief medication admitted for further management.  COVID test is pending.  Labs are largely unremarkable.  Review of Systems: As per HPI, rest all negative.   History reviewed. No pertinent past medical history.  Past Surgical History:  Procedure Laterality Date   TONSILLECTOMY       reports that  she has never smoked. She has never been exposed to tobacco smoke. She has never used smokeless tobacco. She reports that she does not drink alcohol and does not use drugs.  No Known Allergies  Family History  Problem Relation Age of Onset   Celiac disease Maternal Grandfather     Prior to Admission medications   Not on File    Physical Exam: Constitutional: Moderately built and nourished. Vitals:   10/04/21 1805 10/04/21 1830 10/04/21 1850 10/04/21 2011  BP: (!) 151/72 119/65 129/73 125/79  Pulse: (!) 108 (!) 102 98 94  Resp: 20 20 20 14   Temp:    98.2 F (36.8 C)  TempSrc:    Oral  SpO2: 99% 99% 100% 98%  Weight:      Height:       Eyes: Anicteric no pallor. ENMT: No discharge from the ears eyes nose and mouth. Neck: No mass felt.  No neck rigidity. Respiratory: No rhonchi or crepitations. Cardiovascular: S1-S2 heard. Abdomen: Mild tenderness periumbilically more on the right lower quadrant.  No rebound tenderness guarding or rigidity. Musculoskeletal: No edema. Skin: No rash. Neurologic: Alert awake oriented to time place and person.  Moves all extremities. Psychiatric: Appears normal.  Normal affect.   Labs on Admission: I have personally reviewed following labs and imaging studies  CBC: Recent Labs  Lab 10/04/21 1554  WBC 10.0  HGB 13.3  HCT 39.3  MCV 85.6  PLT 252   Basic  Metabolic Panel: Recent Labs  Lab 10/04/21 1554  NA 136  K 3.8  CL 99  CO2 27  GLUCOSE 92  BUN 15  CREATININE 0.47  CALCIUM 9.8   GFR: Estimated Creatinine Clearance: 89.4 mL/min (by C-G formula based on SCr of 0.47 mg/dL). Liver Function Tests: Recent Labs  Lab 10/04/21 1554  AST 15  ALT 14  ALKPHOS 59  BILITOT 0.5  PROT 7.4  ALBUMIN 3.9   Recent Labs  Lab 10/04/21 1554  LIPASE <10*   No results for input(s): AMMONIA in the last 168 hours. Coagulation Profile: No results for input(s): INR, PROTIME in the last 168 hours. Cardiac Enzymes: No results for  input(s): CKTOTAL, CKMB, CKMBINDEX, TROPONINI in the last 168 hours. BNP (last 3 results) No results for input(s): PROBNP in the last 8760 hours. HbA1C: No results for input(s): HGBA1C in the last 72 hours. CBG: No results for input(s): GLUCAP in the last 168 hours. Lipid Profile: No results for input(s): CHOL, HDL, LDLCALC, TRIG, CHOLHDL, LDLDIRECT in the last 72 hours. Thyroid Function Tests: No results for input(s): TSH, T4TOTAL, FREET4, T3FREE, THYROIDAB in the last 72 hours. Anemia Panel: No results for input(s): VITAMINB12, FOLATE, FERRITIN, TIBC, IRON, RETICCTPCT in the last 72 hours. Urine analysis:    Component Value Date/Time   COLORURINE YELLOW 10/04/2021 1529   APPEARANCEUR CLEAR 10/04/2021 1529   LABSPEC 1.011 10/04/2021 1529   PHURINE 6.0 10/04/2021 1529   GLUCOSEU NEGATIVE 10/04/2021 1529   HGBUR NEGATIVE 10/04/2021 1529   BILIRUBINUR NEGATIVE 10/04/2021 1529   KETONESUR NEGATIVE 10/04/2021 1529   PROTEINUR NEGATIVE 10/04/2021 1529   NITRITE NEGATIVE 10/04/2021 1529   LEUKOCYTESUR NEGATIVE 10/04/2021 1529   Sepsis Labs: @LABRCNTIP (procalcitonin:4,lacticidven:4) )No results found for this or any previous visit (from the past 240 hour(s)).   Radiological Exams on Admission: CT Abdomen Pelvis W Contrast  Result Date: 10/04/2021 CLINICAL DATA:  Abdominal distension, severe diffuse abdominal pain and gas like feelings, thinks she has an undiagnosed gluten sensitivity and had a donut Saturday night EXAM: CT ABDOMEN AND PELVIS WITH CONTRAST TECHNIQUE: Multidetector CT imaging of the abdomen and pelvis was performed using the standard protocol following bolus administration of intravenous contrast. CONTRAST:  164mL OMNIPAQUE IOHEXOL 300 MG/ML SOLN IV. No oral contrast. COMPARISON:  None FINDINGS: Lower chest: Lung bases clear Hepatobiliary: Gallbladder and liver normal appearance Pancreas: Normal appearance Spleen: Normal appearance Adrenals/Urinary Tract: Adrenal glands,  kidneys, ureters, and bladder normal appearance Stomach/Bowel: Appendix enlarged, 9 mm thick though mildly tapering distally. Extensive inflammatory process identified in the RIGHT pelvis with diffuse infiltration of mesenteric planes, thickening of cecum, periappendiceal infiltration, and thickening of distal small bowel loops. Associated free fluid in pelvis and mesentery. Dilated proximal small bowel loops some which show upper normal wall thickness. Colon decompressed. Stomach unremarkable. Ingested radiopacities within colon. Vascular/Lymphatic: Vascular structures patent. Aorta normal caliber. Scattered normal size mesenteric lymph nodes. Reproductive: Unremarkable uterus and adnexa Other: Free fluid in pelvis.  No free air.  No hernia or abscess. Musculoskeletal: No acute osseous findings. IMPRESSION: Extensive inflammatory process in the RIGHT pelvis with diffuse infiltration of mesenteric planes, thickening of cecum, and distal small bowel loops, associated with free fluid in pelvis and mesentery. The appendix is mildly enlarged and does demonstrate a degree of peri-appendiceal infiltration, though it is uncertain whether this is the epicenter of the process or secondarily involved. Findings favor acute appendicitis with associated peritonitis and wall thickening of small bowel loops in the pelvis versus less likely diffuse  enteritis of the distal small bowel loops such as from inflammatory bowel disease or infectious etiology with associated regional inflammatory changes. Secondary ileus with dilatation of proximal small bowel. Free fluid without free air or definite abscess. Electronically Signed   By: Lavonia Dana M.D.   On: 10/04/2021 17:36      Assessment/Plan Principal Problem:   Abdominal pain    Abdominal pain -at this time constellation of findings concerning for possible inflammatory bowel disease as seen by general surgery Dr. Dema Severin.  We will keep patient on empiric antibiotic IV fluids  n.p.o. consult gastroenterologist may require colonoscopy and biopsy.  COVID test is pending.  Since patient has abdominal pain with CT scan findings concerning for inflammatory bowel disease will need further management and procedures and will need inpatient status.   DVT prophylaxis: SCDs in anticipation of procedure will avoid anticoagulation. Code Status: Full code. Family Communication: Discussed with patient. Disposition Plan: Home. Consults called: General surgery.  Will consult gastroenterology. Admission status: Inpatient.   Rise Patience MD [facility] Pager [phone].  If 7PM-7AM, please contact night-coverage [email or web address] Password [email or web address]  10/04/2021, 10:06 PM

## 2021-10-04 NOTE — ED Triage Notes (Signed)
Patinet reports she has been trying to not eat gluten as she thinks she has an undiagnosed sensitivity but reports she had a donut on Saturday night and since then she has had severe diffuse pain and gas like feelings

## 2021-10-05 DIAGNOSIS — R110 Nausea: Secondary | ICD-10-CM

## 2021-10-05 DIAGNOSIS — R197 Diarrhea, unspecified: Secondary | ICD-10-CM

## 2021-10-05 LAB — CBC WITH DIFFERENTIAL/PLATELET
Abs Immature Granulocytes: 0.02 10*3/uL (ref 0.00–0.07)
Basophils Absolute: 0 10*3/uL (ref 0.0–0.1)
Basophils Relative: 0 %
Eosinophils Absolute: 0.3 10*3/uL (ref 0.0–0.5)
Eosinophils Relative: 5 %
HCT: 34.7 % — ABNORMAL LOW (ref 36.0–46.0)
Hemoglobin: 11.5 g/dL — ABNORMAL LOW (ref 12.0–15.0)
Immature Granulocytes: 0 %
Lymphocytes Relative: 17 %
Lymphs Abs: 1.1 10*3/uL (ref 0.7–4.0)
MCH: 29.1 pg (ref 26.0–34.0)
MCHC: 33.1 g/dL (ref 30.0–36.0)
MCV: 87.8 fL (ref 80.0–100.0)
Monocytes Absolute: 1 10*3/uL (ref 0.1–1.0)
Monocytes Relative: 16 %
Neutro Abs: 4 10*3/uL (ref 1.7–7.7)
Neutrophils Relative %: 62 %
Platelets: 224 10*3/uL (ref 150–400)
RBC: 3.95 MIL/uL (ref 3.87–5.11)
RDW: 13.8 % (ref 11.5–15.5)
WBC: 6.4 10*3/uL (ref 4.0–10.5)
nRBC: 0 % (ref 0.0–0.2)

## 2021-10-05 LAB — COMPREHENSIVE METABOLIC PANEL
ALT: 14 U/L (ref 0–44)
AST: 14 U/L — ABNORMAL LOW (ref 15–41)
Albumin: 2.8 g/dL — ABNORMAL LOW (ref 3.5–5.0)
Alkaline Phosphatase: 55 U/L (ref 38–126)
Anion gap: 8 (ref 5–15)
BUN: 8 mg/dL (ref 6–20)
CO2: 24 mmol/L (ref 22–32)
Calcium: 8.5 mg/dL — ABNORMAL LOW (ref 8.9–10.3)
Chloride: 103 mmol/L (ref 98–111)
Creatinine, Ser: 0.52 mg/dL (ref 0.44–1.00)
GFR, Estimated: >60 mL/min (ref >60)
Glucose, Bld: 117 mg/dL — ABNORMAL HIGH (ref 70–99)
Potassium: 3.4 mmol/L — ABNORMAL LOW (ref 3.5–5.1)
Sodium: 135 mmol/L (ref 135–145)
Total Bilirubin: 0.8 mg/dL (ref 0.3–1.2)
Total Protein: 5.8 g/dL — ABNORMAL LOW (ref 6.5–8.1)

## 2021-10-05 LAB — C-REACTIVE PROTEIN: CRP: 15.6 mg/dL — ABNORMAL HIGH (ref <1.0)

## 2021-10-05 LAB — SEDIMENTATION RATE: Sed Rate: 49 mm/hr — ABNORMAL HIGH (ref 0–22)

## 2021-10-05 LAB — HIV ANTIBODY (ROUTINE TESTING W REFLEX): HIV Screen 4th Generation wRfx: NONREACTIVE

## 2021-10-05 MED ORDER — PEG-KCL-NACL-NASULF-NA ASC-C 100 G PO SOLR
0.5000 | Freq: Once | ORAL | Status: AC
  Administered 2021-10-05: 100 g via ORAL
  Filled 2021-10-05: qty 1

## 2021-10-05 MED ORDER — PEG-KCL-NACL-NASULF-NA ASC-C 100 G PO SOLR: 1.0000 | Freq: Once | ORAL | Status: DC

## 2021-10-05 MED ORDER — ONDANSETRON HCL 4 MG/2ML IJ SOLN
4.0000 mg | Freq: Four times a day (QID) | INTRAMUSCULAR | Status: DC | PRN
  Administered 2021-10-05: 4 mg via INTRAVENOUS
  Filled 2021-10-05: qty 2

## 2021-10-05 NOTE — Consult Note (Addendum)
   Attending physician's note   I have taken an interval history, reviewed the chart and examined the patient. I agree with the Advanced Practitioner's note, impression, and recommendations as outlined.   50-year-old female with medical history as outlined presents with crampy abdominal pain for the last week or so with associated loose, nonbloody stools.  Nausea with 1-2 episodes (3-day period.  No history of fever.  Has had similar episodes x2 earlier this year, but was pronounced.  The symptoms typically last 3 to 3 days resolve with resolution.  No previous EGD or colonoscopy.  Admission evaluation notable for the following: - WBC 10, H/H13.3/39, PLT 3 2 - Normal CMP - CT abdomen/pelvis on admission reviewed by Dr. White and discussed with Radiology.  Inflammatory changes in the right pelvis involving the cecum, periappendiceal area, and distal small bowel.  Per discussion with surgery, appears favors IBD rather than acute appendicitis.  1) RLQ pain 2) Inflammatory changes on CT 3) Nausea 4) Diarrhea - Clears okay today, then npo at [place] - Check GI PCR panel, fecal calprotectin, ESR, CRP.  Clinical/radiographic suspicion of IBD - Antiemetics prn today.  Reasonable to give antiemetics scheduled prior to starting bowel preparation - Bowel prep today with plan for colonoscopy tomorrow  The indications, risks, and benefits of colonoscopy were explained to the patient in detail. Risks include but are not limited to bleeding, perforation, adverse reaction to medications, and cardiopulmonary compromise.  Discussed increased risks in the setting of active inflammation and CT findings.  Sequelae include but are not limited to the possibility of surgery, prolonged hospitalization, and mortality. The patient verbalized understanding and wished to proceed. All questions answered. Further recommendations pending results of the exam.    Syris Brookens, DO, FACG [phone] office         Consultation  Referring Provider:  [facility]/ Samtani MD Primary Care Physician:  Eksir, Samantha A, MD Primary Gastroenterologist:  none  Reason for Consultation:   acute abdominal pain, abnormal CT  HPI: Erin Houston is a 50 y.o. female, generally in good health, who was admitted through the emergency room last evening after onset of acute abdominal pain on Saturday, 09/30/2021.  She describes a crampy spasmy type abdominal pain and gassiness, which persisted throughout this week.  She has had some intermittent diarrhea and had been nauseated all week but had only a couple of episodes of vomiting on Tuesday.  No associated fever or chills.  She was having increasing abdominal discomfort with p.o. intake so backed herself off primarily to liquids.  She says yesterday the pain localized in her mid abdomen and has persisted there. She had to previous discrete episode since April 2022 which were similar to this but less severe and resolved within a couple of days.  She has altered her diet a bit over the past several months because of these symptoms, had started avoiding gluten, and eating lighter. Weight has been stable until this past week, no prior abdominal surgery. She has been morning as well as her mom passed away about 2 months ago. Family history pertinent for celiac disease, also with paternal grandfather with colon cancer diagnosed in his later years. Work-up in the emergency room last evening with normal CBC, normal c-Met, respiratory panel negative, lipase less than 10. Today hemoglobin has drifted to 11.5/hematocrit 34.7  CT of the abdomen pelvis with contrast showed an enlarged appendix thickened though mildly tapering distally, there is also extensive inflammatory process in the right pelvis   with diffuse infiltration of the mesenteric planes and thickening involving the cecum, periappendiceal area and distal small bowel loops, small amount of  free fluid in the pelvis as well.  Findings  favor acute appendicitis with peritonitis, less likely diffuse enteritis There is some dilation of small bowel loops proximally but no obstruction.  Patient says she feels a little bit better this morning because she had 4 small loose bowel movements during the night..   History reviewed. No pertinent past medical history.  Past Surgical History:  Procedure Laterality Date   TONSILLECTOMY      Prior to Admission medications   Medication Sig Start Date End Date Taking? Authorizing Provider  cholecalciferol (VITAMIN D) 25 MCG (1000 UNIT) tablet Take 1,000 Units by mouth at bedtime.   Yes [provider]  fluticasone (FLONASE) 50 MCG/ACT nasal spray Place 1 spray into the nose daily as needed for allergies.   Yes [provider]  ibuprofen (ADVIL) 200 MG tablet Take 400 mg by mouth every 6 (six) hours as needed for headache or moderate pain.   Yes [provider]  loratadine (CLARITIN) 10 MG tablet Take 10 mg by mouth daily as needed for allergies or rhinitis.   Yes [provider]  MAGNESIUM PO Take 1 tablet by mouth at bedtime. OTC   Yes [provider]  raNITIdine HCl (ACID REDUCER PO) Take 1 tablet by mouth daily as needed (stomach pain/swelling).   Yes [provider]  Simethicone (PHAZYME MAXIMUM STRENGTH PO) Take 1 tablet by mouth 2 (two) times daily as needed (stomach pain).   Yes [provider]    Current Facility-Administered Medications  Medication Dose Route Frequency Provider Last Rate Last Admin   acetaminophen (TYLENOL) tablet 650 mg  650 mg Oral Q6H PRN Rise Patience, MD       Or   acetaminophen (TYLENOL) suppository 650 mg  650 mg Rectal Q6H PRN Rise Patience, MD       dextrose 5 % in lactated ringers infusion   Intravenous Continuous Rise Patience, MD 100 mL/hr at 10/05/21 0102 Restarted at 10/05/21 7253   morphine 2 MG/ML injection 1 mg  1 mg Intravenous Q4H PRN Rise Patience, MD        piperacillin-tazobactam (ZOSYN) IVPB 3.375 g  3.375 g Intravenous Q8H Rise Patience, MD 12.5 mL/hr at 10/05/21 0153 3.375 g at 10/05/21 0153    Allergies as of 10/04/2021   (No Known Allergies)    Family History  Problem Relation Age of Onset   Celiac disease Maternal Grandfather     Social History   Socioeconomic History   Marital status: Married    Spouse name: Not on file   Number of children: Not on file   Years of education: Not on file   Highest education level: Not on file  Occupational History   Not on file  Tobacco Use   Smoking status: Never    Passive exposure: Never   Smokeless tobacco: Never  Vaping Use   Vaping Use: Never used  Substance and Sexual Activity   Alcohol use: Never   Drug use: Never   Sexual activity: Yes  Other Topics Concern   Not on file  Social History Narrative   Not on file   Social Determinants of Health   Financial Resource Strain: Not on file  Food Insecurity: Not on file  Transportation Needs: Not on file  Physical Activity: Not on file  Stress: Not on  file  Social Connections: Not on file  Intimate Partner Violence: Not on file    Review of Systems: Pertinent positive and negative review of systems were noted in the above HPI section.  All other review of systems was otherwise negative.  Physical Exam: Vital signs in last 24 hours: Temp:  [98.2 F (36.8 C)-99.1 F (37.3 C)] 99.1 F (37.3 C) (11/10 0357) Pulse Rate:  [94-113] 95 (11/10 0357) Resp:  [12-20] 18 (11/10 0357) BP: (107-151)/(65-87) 111/81 (11/10 0357) SpO2:  [96 %-100 %] 98 % (11/10 0357) Weight:  [81.2 kg-83 kg] 83 kg (11/09 2345) Last BM Date: 10/05/21 General:   Alert,  Well-developed, well-nourished, white female pleasant and cooperative in NAD husband at bedside Head:  Normocephalic and atraumatic. Eyes:  Sclera clear, no icterus.   Conjunctiva pink. Ears:  Normal auditory acuity. Nose:  No deformity, discharge,  or lesions. Mouth:   No deformity or lesions.   Neck:  Supple; no masses or thyromegaly. Lungs:  Clear throughout to auscultation.   No wheezes, crackles, or rhonchi.  Heart:  Regular rate and rhythm; no murmurs, clicks, rubs,  or gallops. Abdomen:  Soft, no real appreciable distention, some fullness, he is tender bilaterally in the lower quadrants right greater than left and also tender in the infraumbilical area BS quiet Rectal: Not done Msk:  Symmetrical without gross deformities. . Pulses:  Normal pulses noted. Extremities:  Without clubbing or edema. Neurologic:  Alert and  oriented x4;  grossly normal neurologically. Skin:  Intact without significant lesions or rashes.. Psych:  Alert and cooperative. Normal mood and affect.  Intake/Output from previous day: 11/09 0701 - 11/10 0700 In: 1728 [I.V.:628; IV Piggyback:1100] Out: 1 [Urine:1] Intake/Output this shift: No intake/output data recorded.  Lab Results: Recent Labs    10/04/21 1554 10/05/21 0541  WBC 10.0 6.4  HGB 13.3 11.5*  HCT 39.3 34.7*  PLT 252 224   BMET Recent Labs    10/04/21 1554 10/05/21 0541  NA 136 135  K 3.8 3.4*  CL 99 103  CO2 27 24  GLUCOSE 92 117*  BUN 15 8  CREATININE 0.47 0.52  CALCIUM 9.8 8.5*   LFT Recent Labs    10/05/21 0541  PROT 5.8*  ALBUMIN 2.8*  AST 14*  ALT 14  ALKPHOS 55  BILITOT 0.8   PT/INR No results for input(s): LABPROT, INR in the last 72 hours. Hepatitis Panel No results for input(s): HEPBSAG, HCVAB, HEPAIGM, HEPBIGM in the last 72 hours.   IMPRESSION:  #36 50 year old white female admitted with 5-day history of persistent and somewhat progressive abdominal pain which is now localized to the mid abdomen, associated with nausea, intermittent vomiting, intermittent diarrhea/loose stool.  No associated fever or chills 2 similar episodes since April though not as severe or as long-lasting  CT shows an extensive inflammatory process in the right pelvis, diffuse infiltration of the  mesenteric planes, involvement of the cecum, distal small bowel loops and periappendiceal area.  And has been seen by surgery who does not feel her picture is consistent with acute appendicitis.  Normal CBC also argues against this.  Rule out IBD/Crohn's, rule out other infiltrative process infectious versus malignancy.  Plan; there are liquid diet today, n.p.o. after midnight Bowel prep to start this afternoon and will plan for colonoscopy tomorrow with Dr. Bryan Lemma Hopefully she will be able to tolerate a bowel prep. Check sed rate and CRP Antiemetics as needed  GI will follow with you, further recommendations ending findings  at colonoscopy.    Amy Esterwood PA-C 10/05/2021, 10:07 AM    

## 2021-10-05 NOTE — H&P (View-Only) (Signed)
Attending physician's note   I have taken an interval history, reviewed the chart and examined the patient. I agree with the Advanced Practitioner's note, impression, and recommendations as outlined.   50 year old female with medical history as outlined presents with crampy abdominal pain for the last week or so with associated loose, nonbloody stools.  Nausea with 1-2 episodes (3-day period.  No history of fever.  Has had similar episodes x2 earlier this year, but was pronounced.  The symptoms typically last 3 to 3 days resolve with resolution.  No previous EGD or colonoscopy.  Admission evaluation notable for the following: - WBC 10, H/H13.3/39, PLT 3 2 - Normal CMP - CT abdomen/pelvis on admission reviewed by Dr. Dema Severin and discussed with Radiology.  Inflammatory changes in the right pelvis involving the cecum, periappendiceal area, and distal small bowel.  Per discussion with surgery, appears favors IBD rather than acute appendicitis.  1) RLQ pain 2) Inflammatory changes on CT 3) Nausea 4) Diarrhea - Clears okay today, then npo at [facility] - Check GI PCR panel, fecal calprotectin, ESR, CRP.  Clinical/radiographic suspicion of IBD - Antiemetics prn today.  Reasonable to give antiemetics scheduled prior to starting bowel preparation - Bowel prep today with plan for colonoscopy tomorrow  The indications, risks, and benefits of colonoscopy were explained to the patient in detail. Risks include but are not limited to bleeding, perforation, adverse reaction to medications, and cardiopulmonary compromise.  Discussed increased risks in the setting of active inflammation and CT findings.  Sequelae include but are not limited to the possibility of surgery, prolonged hospitalization, and mortality. The patient verbalized understanding and wished to proceed. All questions answered. Further recommendations pending results of the exam.    Gerrit Heck, DO, FACG [phone] office         Consultation  Referring Provider:  TR[facility]/ Samtani MD Primary Care Physician:  Nickola Major, MD Primary Gastroenterologist:  none  Reason for Consultation:   acute abdominal pain, abnormal CT  HPI: Erin Houston is a 50 y.o. female, generally in good health, who was admitted through the emergency room last evening after onset of acute abdominal pain on Saturday, 09/30/2021.  She describes a crampy spasmy type abdominal pain and gassiness, which persisted throughout this week.  She has had some intermittent diarrhea and had been nauseated all week but had only a couple of episodes of vomiting on Tuesday.  No associated fever or chills.  She was having increasing abdominal discomfort with p.o. intake so backed herself off primarily to liquids.  She says yesterday the pain localized in her mid abdomen and has persisted there. She had to previous discrete episode since April 2022 which were similar to this but less severe and resolved within a couple of days.  She has altered her diet a bit over the past several months because of these symptoms, had started avoiding gluten, and eating lighter. Weight has been stable until this past week, no prior abdominal surgery. She has been morning as well as her mom passed away about 2 months ago. Family history pertinent for celiac disease, also with paternal grandfather with colon cancer diagnosed in his later years. Work-up in the emergency room last evening with normal CBC, normal c-Met, respiratory panel negative, lipase less than 10. Today hemoglobin has drifted to 11.5/hematocrit 34.7  CT of the abdomen pelvis with contrast showed an enlarged appendix thickened though mildly tapering distally, there is also extensive inflammatory process in the right pelvis  with diffuse infiltration of the mesenteric planes and thickening involving the cecum, periappendiceal area and distal small bowel loops, small amount of  free fluid in the pelvis as well.  Findings  favor acute appendicitis with peritonitis, less likely diffuse enteritis There is some dilation of small bowel loops proximally but no obstruction.  Patient says she feels a little bit better this morning because she had 4 small loose bowel movements during the night..   History reviewed. No pertinent past medical history.  Past Surgical History:  Procedure Laterality Date   TONSILLECTOMY      Prior to Admission medications   Medication Sig Start Date End Date Taking? Authorizing Provider  cholecalciferol (VITAMIN D) 25 MCG (1000 UNIT) tablet Take 1,000 Units by mouth at bedtime.   Yes [provider]  fluticasone (FLONASE) 50 MCG/ACT nasal spray Place 1 spray into the nose daily as needed for allergies.   Yes [provider]  ibuprofen (ADVIL) 200 MG tablet Take 400 mg by mouth every 6 (six) hours as needed for headache or moderate pain.   Yes [provider]  loratadine (CLARITIN) 10 MG tablet Take 10 mg by mouth daily as needed for allergies or rhinitis.   Yes [provider]  MAGNESIUM PO Take 1 tablet by mouth at bedtime. OTC   Yes [provider]  raNITIdine HCl (ACID REDUCER PO) Take 1 tablet by mouth daily as needed (stomach pain/swelling).   Yes [provider]  Simethicone (PHAZYME MAXIMUM STRENGTH PO) Take 1 tablet by mouth 2 (two) times daily as needed (stomach pain).   Yes [provider]    Current Facility-Administered Medications  Medication Dose Route Frequency Provider Last Rate Last Admin   acetaminophen (TYLENOL) tablet 650 mg  650 mg Oral Q6H PRN Rise Patience, MD       Or   acetaminophen (TYLENOL) suppository 650 mg  650 mg Rectal Q6H PRN Rise Patience, MD       dextrose 5 % in lactated ringers infusion   Intravenous Continuous Rise Patience, MD 100 mL/hr at 10/05/21 0102 Restarted at 10/05/21 7253   morphine 2 MG/ML injection 1 mg  1 mg Intravenous Q4H PRN Rise Patience, MD        piperacillin-tazobactam (ZOSYN) IVPB 3.375 g  3.375 g Intravenous Q8H Rise Patience, MD 12.5 mL/hr at 10/05/21 0153 3.375 g at 10/05/21 0153    Allergies as of 10/04/2021   (No Known Allergies)    Family History  Problem Relation Age of Onset   Celiac disease Maternal Grandfather     Social History   Socioeconomic History   Marital status: Married    Spouse name: Not on file   Number of children: Not on file   Years of education: Not on file   Highest education level: Not on file  Occupational History   Not on file  Tobacco Use   Smoking status: Never    Passive exposure: Never   Smokeless tobacco: Never  Vaping Use   Vaping Use: Never used  Substance and Sexual Activity   Alcohol use: Never   Drug use: Never   Sexual activity: Yes  Other Topics Concern   Not on file  Social History Narrative   Not on file   Social Determinants of Health   Financial Resource Strain: Not on file  Food Insecurity: Not on file  Transportation Needs: Not on file  Physical Activity: Not on file  Stress: Not on  file  Social Connections: Not on file  Intimate Partner Violence: Not on file    Review of Systems: Pertinent positive and negative review of systems were noted in the above HPI section.  All other review of systems was otherwise negative.  Physical Exam: Vital signs in last 24 hours: Temp:  [98.2 F (36.8 C)-99.1 F (37.3 C)] 99.1 F (37.3 C) (11/10 0357) Pulse Rate:  [94-113] 95 (11/10 0357) Resp:  [12-20] 18 (11/10 0357) BP: (107-151)/(65-87) 111/81 (11/10 0357) SpO2:  [96 %-100 %] 98 % (11/10 0357) Weight:  [81.2 kg-83 kg] 83 kg (11/09 2345) Last BM Date: 10/05/21 General:   Alert,  Well-developed, well-nourished, white female pleasant and cooperative in NAD husband at bedside Head:  Normocephalic and atraumatic. Eyes:  Sclera clear, no icterus.   Conjunctiva pink. Ears:  Normal auditory acuity. Nose:  No deformity, discharge,  or lesions. Mouth:   No deformity or lesions.   Neck:  Supple; no masses or thyromegaly. Lungs:  Clear throughout to auscultation.   No wheezes, crackles, or rhonchi.  Heart:  Regular rate and rhythm; no murmurs, clicks, rubs,  or gallops. Abdomen:  Soft, no real appreciable distention, some fullness, he is tender bilaterally in the lower quadrants right greater than left and also tender in the infraumbilical area BS quiet Rectal: Not done Msk:  Symmetrical without gross deformities. . Pulses:  Normal pulses noted. Extremities:  Without clubbing or edema. Neurologic:  Alert and  oriented x4;  grossly normal neurologically. Skin:  Intact without significant lesions or rashes.. Psych:  Alert and cooperative. Normal mood and affect.  Intake/Output from previous day: 11/09 0701 - 11/10 0700 In: 1728 [I.V.:628; IV Piggyback:1100] Out: 1 [Urine:1] Intake/Output this shift: No intake/output data recorded.  Lab Results: Recent Labs    10/04/21 1554 10/05/21 0541  WBC 10.0 6.4  HGB 13.3 11.5*  HCT 39.3 34.7*  PLT 252 224   BMET Recent Labs    10/04/21 1554 10/05/21 0541  NA 136 135  K 3.8 3.4*  CL 99 103  CO2 27 24  GLUCOSE 92 117*  BUN 15 8  CREATININE 0.47 0.52  CALCIUM 9.8 8.5*   LFT Recent Labs    10/05/21 0541  PROT 5.8*  ALBUMIN 2.8*  AST 14*  ALT 14  ALKPHOS 55  BILITOT 0.8   PT/INR No results for input(s): LABPROT, INR in the last 72 hours. Hepatitis Panel No results for input(s): HEPBSAG, HCVAB, HEPAIGM, HEPBIGM in the last 72 hours.   IMPRESSION:  #61 50 year old white female admitted with 5-day history of persistent and somewhat progressive abdominal pain which is now localized to the mid abdomen, associated with nausea, intermittent vomiting, intermittent diarrhea/loose stool.  No associated fever or chills 2 similar episodes since April though not as severe or as long-lasting  CT shows an extensive inflammatory process in the right pelvis, diffuse infiltration of the  mesenteric planes, involvement of the cecum, distal small bowel loops and periappendiceal area.  And has been seen by surgery who does not feel her picture is consistent with acute appendicitis.  Normal CBC also argues against this.  Rule out IBD/Crohn's, rule out other infiltrative process infectious versus malignancy.  Plan; there are liquid diet today, n.p.o. after midnight Bowel prep to start this afternoon and will plan for colonoscopy tomorrow with Dr. Bryan Lemma Hopefully she will be able to tolerate a bowel prep. Check sed rate and CRP Antiemetics as needed  GI will follow with you, further recommendations ending findings  at colonoscopy.    Amy Esterwood PA-C 10/05/2021, 10:07 AM

## 2021-10-05 NOTE — Progress Notes (Signed)
Progress Note     Subjective: Patient reports feeling a little better this AM but still having some intermittent cramping. She has had 4 loose BM overnight, maybe with some mucus present. Denies nausea or vomiting. She reports this episode started after eating a sourdough bagel and wondering if this has to do with gluten   Objective: Vital signs in last 24 hours: Temp:  [98.2 F (36.8 C)-99.1 F (37.3 C)] 99.1 F (37.3 C) (11/10 0357) Pulse Rate:  [94-113] 95 (11/10 0357) Resp:  [12-20] 18 (11/10 0357) BP: (107-151)/(65-87) 111/81 (11/10 0357) SpO2:  [96 %-100 %] 98 % (11/10 0357) Weight:  [81.2 kg-83 kg] 83 kg (11/09 2345) Last BM Date: 10/05/21  Intake/Output from previous day: 11/09 0701 - 11/10 0700 In: 1728 [I.V.:628; IV Piggyback:1100] Out: 1 [Urine:1] Intake/Output this shift: No intake/output data recorded.  PE: General: pleasant, WD, WN female who is laying in bed in NAD Heart: regular, rate, and rhythm.   Lungs: Respiratory effort nonlabored Abd: soft, mild generalized ttp without peritonitis, ND, +BS, no masses, hernias, or organomegaly MS: all 4 extremities are symmetrical with no cyanosis, clubbing, or edema. Skin: warm and dry with no masses, lesions, or rashes Psych: A&Ox3 with an appropriate affect.    Lab Results:  Recent Labs    10/04/21 1554 10/05/21 0541  WBC 10.0 6.4  HGB 13.3 11.5*  HCT 39.3 34.7*  PLT 252 224   BMET Recent Labs    10/04/21 1554 10/05/21 0541  NA 136 135  K 3.8 3.4*  CL 99 103  CO2 27 24  GLUCOSE 92 117*  BUN 15 8  CREATININE 0.47 0.52  CALCIUM 9.8 8.5*   PT/INR No results for input(s): LABPROT, INR in the last 72 hours. CMP     Component Value Date/Time   NA 135 10/05/2021 0541   K 3.4 (L) 10/05/2021 0541   CL 103 10/05/2021 0541   CO2 24 10/05/2021 0541   GLUCOSE 117 (H) 10/05/2021 0541   BUN 8 10/05/2021 0541   CREATININE 0.52 10/05/2021 0541   CALCIUM 8.5 (L) 10/05/2021 0541   PROT 5.8 (L)  10/05/2021 0541   ALBUMIN 2.8 (L) 10/05/2021 0541   AST 14 (L) 10/05/2021 0541   ALT 14 10/05/2021 0541   ALKPHOS 55 10/05/2021 0541   BILITOT 0.8 10/05/2021 0541   GFRNONAA >60 10/05/2021 0541   Lipase     Component Value Date/Time   LIPASE <10 (L) 10/04/2021 1554       Studies/Results: CT Abdomen Pelvis W Contrast  Result Date: 10/04/2021 CLINICAL DATA:  Abdominal distension, severe diffuse abdominal pain and gas like feelings, thinks she has an undiagnosed gluten sensitivity and had a donut Saturday night EXAM: CT ABDOMEN AND PELVIS WITH CONTRAST TECHNIQUE: Multidetector CT imaging of the abdomen and pelvis was performed using the standard protocol following bolus administration of intravenous contrast. CONTRAST:  141mL OMNIPAQUE IOHEXOL 300 MG/ML SOLN IV. No oral contrast. COMPARISON:  None FINDINGS: Lower chest: Lung bases clear Hepatobiliary: Gallbladder and liver normal appearance Pancreas: Normal appearance Spleen: Normal appearance Adrenals/Urinary Tract: Adrenal glands, kidneys, ureters, and bladder normal appearance Stomach/Bowel: Appendix enlarged, 9 mm thick though mildly tapering distally. Extensive inflammatory process identified in the RIGHT pelvis with diffuse infiltration of mesenteric planes, thickening of cecum, periappendiceal infiltration, and thickening of distal small bowel loops. Associated free fluid in pelvis and mesentery. Dilated proximal small bowel loops some which show upper normal wall thickness. Colon decompressed. Stomach unremarkable. Ingested radiopacities within colon.  Vascular/Lymphatic: Vascular structures patent. Aorta normal caliber. Scattered normal size mesenteric lymph nodes. Reproductive: Unremarkable uterus and adnexa Other: Free fluid in pelvis.  No free air.  No hernia or abscess. Musculoskeletal: No acute osseous findings. IMPRESSION: Extensive inflammatory process in the RIGHT pelvis with diffuse infiltration of mesenteric planes, thickening of  cecum, and distal small bowel loops, associated with free fluid in pelvis and mesentery. The appendix is mildly enlarged and does demonstrate a degree of peri-appendiceal infiltration, though it is uncertain whether this is the epicenter of the process or secondarily involved. Findings favor acute appendicitis with associated peritonitis and wall thickening of small bowel loops in the pelvis versus less likely diffuse enteritis of the distal small bowel loops such as from inflammatory bowel disease or infectious etiology with associated regional inflammatory changes. Secondary ileus with dilatation of proximal small bowel. Free fluid without free air or definite abscess. Electronically Signed   By: Lavonia Dana M.D.   On: 10/04/2021 17:36    Anti-infectives: Anti-infectives (From admission, onward)    Start     Dose/Rate Route Frequency Ordered Stop   10/05/21 0200  piperacillin-tazobactam (ZOSYN) IVPB 3.375 g        3.375 g 12.5 mL/hr over 240 Minutes Intravenous Every 8 hours 10/04/21 2210     10/04/21 1800  piperacillin-tazobactam (ZOSYN) IVPB 3.375 g        3.375 g 100 mL/hr over 30 Minutes Intravenous  Once 10/04/21 1751 10/04/21 1839        Assessment/Plan Abdominal pain  Inflammatory process in RLQ - imaging findings suspicious for Crohn's disease, pt also reports exposure to gluten right before symptoms started  - afebrile, no leukocytosis - on IV zosyn - GI to consult today  - no indication for emergent surgical intervention, but we will follow   FEN: NPO, IVF@100  cc/h VTE: SCDs ID: [email or web address]  LOS: 1 day    Norm Parcel, [facility] Surgery 10/05/2021, 8:32 AM Please see Amion for pager number during day hours 7:00am-4:30pm

## 2021-10-05 NOTE — Progress Notes (Signed)
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  Acute appendicitis--however on overview with general surgery who saw the patient it seems that she had 2 prior attacks this year which may be IBD or Crohn's IV antibiotics started-GI consulted-patient kept on fluid diet   Hospital-Problem based course  Undifferentiated abdominal pain IBD versus appendicitis--elevated ESR and CRP argue for inflammatory process Pain seems controlled-May use morphine 1 mg every 4 as needed severe pain Continue Zosyn at this time, continue D5 LR 100 cc/H-follow fecal calprotectin GI pathogen panel Looks like probable colonoscopy 10/06/2021  DVT prophylaxis: SCD Code Status: Full Family Communication: None present Disposition:  Status is: Inpatient  Remains inpatient appropriate because: Not ready for discharge       Consultants:  Gastroenterology General surgery   Procedures: n  Antimicrobials: Zosyn   Subjective: Feels fair has central abdominal pain feels bloated passing some stool no nausea vomiting today no fever  Objective: Vitals:   10/04/21 2316 10/04/21 2330 10/04/21 2345 10/05/21 0357  BP: 107/66 117/78  111/81  Pulse: 97 (!) 104  95  Resp: _0 Temp: 98.4 F (36.9 C) 98.9 F (37.2 C)  99.1 F (37.3 C)  TempSrc: Oral Oral    SpO2: 96% 98%  98%  Weight:   83 kg   Height:        Intake/Output Summary (Last 24 hours) at 10/05/2021 1520 Last data filed at 10/05/2021 1400 Gross per 24 hour  Intake 2373.67 ml  Output 1 ml  Net 2372.67 ml   Filed Weights   10/04/21 1537 10/04/21 2345  Weight: 81.2 kg 83 kg     Examination:  Awake coherent no distress EOMI NCAT no focal deficit Mucosa moist S1-S2 no murmur CTA B no added sound ROM intact No lower extremity edema  Data Reviewed: personally reviewed   CBC    Component Value Date/Time   WBC 6.4 10/05/2021 0541   RBC 3.95 10/05/2021 0541   HGB 11.5 (L) 10/05/2021 0541   HCT 34.7 (L) 10/05/2021 0541   PLT 224 10/05/2021 0541   MCV 87.8 10/05/2021 0541   MCH 29.1 10/05/2021 0541   MCHC 33.1 10/05/2021 0541   RDW 13.8 10/05/2021 0541   LYMPHSABS 1.1 10/05/2021 0541   MONOABS 1.0 10/05/2021 0541   EOSABS 0.3 10/05/2021 0541   BASOSABS 0.0 10/05/2021 0541   CMP Latest Ref Rng & Units 10/05/2021 10/04/2021  Glucose 70 - 99 mg/dL 117(H) 92  BUN 6 - 20 mg/dL 8 15  Creatinine 0.44 - 1.00 mg/dL 0.52 0.47  Sodium 135 - 145 mmol/L 135 136  Potassium 3.5 - 5.1 mmol/L 3.4(L) 3.8  Chloride 98 - 111 mmol/L 103 99  CO2 22 - 32 mmol/L 24 27  Calcium 8.9 - 10.3 mg/dL 8.5(L) 9.8  Total Protein 6.5 - 8.1 g/dL 5.8(L) 7.4  Total Bilirubin 0.3 - 1.2 mg/dL 0.8 0.5  Alkaline Phos 38 - 126 U/L 55 59  AST 15 - 41 U/L 14(L) 15  ALT 0 - 44 U/L 14 14     Radiology Studies: CT Abdomen Pelvis W Contrast  Result Date: 10/04/2021 CLINICAL DATA:  Abdominal distension, severe diffuse abdominal  pain and gas like feelings, thinks she has an undiagnosed gluten sensitivity and had a donut Saturday night EXAM: CT ABDOMEN AND PELVIS WITH CONTRAST TECHNIQUE: Multidetector CT imaging of the abdomen and pelvis was performed using the standard protocol following bolus administration of intravenous contrast. CONTRAST:  158m OMNIPAQUE IOHEXOL 300 MG/ML SOLN IV. No oral contrast. COMPARISON:  None FINDINGS: Lower chest: Lung bases clear Hepatobiliary: Gallbladder and liver normal appearance Pancreas: Normal appearance Spleen: Normal appearance Adrenals/Urinary Tract: Adrenal glands, kidneys, ureters, and bladder normal appearance Stomach/Bowel: Appendix enlarged, 9 mm  thick though mildly tapering distally. Extensive inflammatory process identified in the RIGHT pelvis with diffuse infiltration of mesenteric planes, thickening of cecum, periappendiceal infiltration, and thickening of distal small bowel loops. Associated free fluid in pelvis and mesentery. Dilated proximal small bowel loops some which show upper normal wall thickness. Colon decompressed. Stomach unremarkable. Ingested radiopacities within colon. Vascular/Lymphatic: Vascular structures patent. Aorta normal caliber. Scattered normal size mesenteric lymph nodes. Reproductive: Unremarkable uterus and adnexa Other: Free fluid in pelvis.  No free air.  No hernia or abscess. Musculoskeletal: No acute osseous findings. IMPRESSION: Extensive inflammatory process in the RIGHT pelvis with diffuse infiltration of mesenteric planes, thickening of cecum, and distal small bowel loops, associated with free fluid in pelvis and mesentery. The appendix is mildly enlarged and does demonstrate a degree of peri-appendiceal infiltration, though it is uncertain whether this is the epicenter of the process or secondarily involved. Findings favor acute appendicitis with associated peritonitis and wall thickening of small bowel loops in the pelvis versus less likely diffuse enteritis of the distal small bowel loops such as from inflammatory bowel disease or infectious etiology with associated regional inflammatory changes. Secondary ileus with dilatation of proximal small bowel. Free fluid without free air or definite abscess. Electronically Signed   By: MLavonia DanaM.D.   On: 10/04/2021 17:36     Scheduled Meds:  peg 3350 powder  0.5 kit Oral Once   And   peg 3350 powder  0.5 kit Oral Once   Continuous Infusions:  dextrose 5% lactated ringers 10 mL/hr at 10/05/21 1007   piperacillin-tazobactam 3.375 g (10/05/21 1020)     LOS: 1 day   Time spent: 2[place] MD [facility] To contact the attending  provider between 7A-7P or the covering provider during after hours 7P-7A, please log into the web site [email or web address] and access using universal [place] password for that web site. If you do not have the password, please call the hospital operator.  10/05/2021, 3:20 PM

## 2021-10-05 NOTE — Plan of Care (Signed)
  Problem: Education: Goal: Knowledge of General Education information will improve Description Including pain rating scale, medication(s)/side effects and non-pharmacologic comfort measures Outcome: Progressing   Problem: Health Behavior/Discharge Planning: Goal: Ability to manage health-related needs will improve Outcome: Progressing   

## 2021-10-06 ENCOUNTER — Encounter (HOSPITAL_COMMUNITY): Payer: Self-pay | Admitting: Internal Medicine

## 2021-10-06 ENCOUNTER — Inpatient Hospital Stay (HOSPITAL_COMMUNITY): Payer: BC Managed Care – PPO | Admitting: Certified Registered Nurse Anesthetist

## 2021-10-06 ENCOUNTER — Encounter (HOSPITAL_COMMUNITY)
Admission: EM | Disposition: A | Discharge status: Home / Self Care | Payer: Self-pay | Source: Home / Self Care | Attending: Family Medicine

## 2021-10-06 DIAGNOSIS — R933 Abnormal findings on diagnostic imaging of other parts of digestive tract: Secondary | ICD-10-CM

## 2021-10-06 DIAGNOSIS — D122 Benign neoplasm of ascending colon: Secondary | ICD-10-CM

## 2021-10-06 DIAGNOSIS — D124 Benign neoplasm of descending colon: Secondary | ICD-10-CM

## 2021-10-06 HISTORY — PX: POLYPECTOMY: SHX5525

## 2021-10-06 HISTORY — PX: BIOPSY: SHX5522

## 2021-10-06 HISTORY — PX: COLONOSCOPY WITH PROPOFOL: SHX5780

## 2021-10-06 LAB — GASTROINTESTINAL PANEL BY PCR, STOOL (REPLACES STOOL CULTURE)

## 2021-10-06 SURGERY — COLONOSCOPY WITH PROPOFOL
Anesthesia: Monitor Anesthesia Care

## 2021-10-06 MED ORDER — PROPOFOL 500 MG/50ML IV EMUL
INTRAVENOUS | Status: DC | PRN
  Administered 2021-10-06: 100 ug/kg/min via INTRAVENOUS

## 2021-10-06 MED ORDER — PROSOURCE PLUS PO LIQD
30.0000 mL | Freq: Two times a day (BID) | ORAL | Status: DC
  Administered 2021-10-06: 30 mL via ORAL
  Filled 2021-10-06: qty 30

## 2021-10-06 MED ORDER — BOOST / RESOURCE BREEZE PO LIQD CUSTOM
1.0000 | Freq: Three times a day (TID) | ORAL | Status: DC
  Administered 2021-10-06: 1 via ORAL
  Filled 2021-10-06 (×2): qty 1

## 2021-10-06 MED ORDER — PROPOFOL 10 MG/ML IV BOLUS
INTRAVENOUS | Status: DC | PRN
  Administered 2021-10-06: 20 mg via INTRAVENOUS
  Administered 2021-10-06: 25 mg via INTRAVENOUS
  Administered 2021-10-06 (×2): 20 mg via INTRAVENOUS
  Administered 2021-10-06: 30 mg via INTRAVENOUS

## 2021-10-06 MED ORDER — PROCHLORPERAZINE EDISYLATE 10 MG/2ML IJ SOLN
10.0000 mg | Freq: Four times a day (QID) | INTRAMUSCULAR | Status: DC | PRN
  Administered 2021-10-06: 10 mg via INTRAVENOUS
  Filled 2021-10-06: qty 2

## 2021-10-06 MED ORDER — FLEET ENEMA 7-19 GM/118ML RE ENEM
ENEMA | RECTAL | Status: AC
  Filled 2021-10-06: qty 1

## 2021-10-06 MED ORDER — BISACODYL 5 MG PO TBEC
10.0000 mg | DELAYED_RELEASE_TABLET | Freq: Once | ORAL | Status: AC
  Administered 2021-10-06: 10 mg via ORAL
  Filled 2021-10-06: qty 2

## 2021-10-06 MED ORDER — LIDOCAINE 2% (20 MG/ML) 5 ML SYRINGE
INTRAMUSCULAR | Status: DC | PRN
  Administered 2021-10-06: 40 mg via INTRAVENOUS

## 2021-10-06 MED ORDER — ADULT MULTIVITAMIN W/MINERALS CH
1.0000 | ORAL_TABLET | Freq: Every day | ORAL | Status: DC
  Administered 2021-10-06: 1 via ORAL
  Filled 2021-10-06: qty 1

## 2021-10-06 MED ORDER — FLEET ENEMA 7-19 GM/118ML RE ENEM
1.0000 | ENEMA | Freq: Once | RECTAL | Status: AC
  Administered 2021-10-06: 1 via RECTAL
  Filled 2021-10-06 (×2): qty 1

## 2021-10-06 MED ORDER — LACTATED RINGERS IV SOLN
INTRAVENOUS | Status: AC | PRN
  Administered 2021-10-06: 20 mL/h via INTRAVENOUS

## 2021-10-06 SURGICAL SUPPLY — 22 items

## 2021-10-06 NOTE — Interval H&P Note (Signed)
History and Physical Interval Note:  10/06/2021 12:43 PM  Erin Houston  has presented today for surgery, with the diagnosis of Abdominal pain, abnormal CT.  The various methods of treatment have been discussed with the patient and family. After consideration of risks, benefits and other options for treatment, the patient has consented to  Procedure(s): COLONOSCOPY WITH PROPOFOL (N/A) as a surgical intervention.  The patient's history has been reviewed, patient examined, no change in status, stable for surgery.  I have reviewed the patient's chart and labs.  Questions were answered to the patient's satisfaction.     [name]

## 2021-10-06 NOTE — Anesthesia Procedure Notes (Signed)
Procedure Name: MAC Date/Time: 10/06/2021 12:48 PM Performed by: Colin Benton, CRNA Pre-anesthesia Checklist: Patient identified, Emergency Drugs available, Suction available and Patient being monitored Patient Re-evaluated:Patient Re-evaluated prior to induction Oxygen Delivery Method: Nasal cannula Induction Type: IV induction Placement Confirmation: positive ETCO2 Dental Injury: Teeth and Oropharynx as per pre-operative assessment

## 2021-10-06 NOTE — Progress Notes (Signed)
Initial Nutrition Assessment  DOCUMENTATION CODES:   Not applicable  INTERVENTION:  Once diet is advanced to clear liquids: -Boost Breeze po TID, each supplement provides 250 kcal and 9 grams of protein -PROSource PLUS PO 40mls BID, each supplement provides 100 kcals and 15 grams of protein -MVI with minerals daily  NUTRITION DIAGNOSIS:   Inadequate oral intake related to nausea as evidenced by per patient/family report.   GOAL:   Patient will meet greater than or equal to 90% of their needs   MONITOR:   PO intake, Supplement acceptance, Diet advancement, Weight trends, Labs, I & O's  REASON FOR ASSESSMENT:   Malnutrition Screening Tool    ASSESSMENT:   Pt with no PMH admitted with abdominal pain.  CT abdomen/pelvis on admission showed inflammatory changes in the R pelvis involving the cecum, periappendiceal area, and distal small bowel. Per Surgery, appears to be more consistent with IBD.   Pt unavailable at time of RD visit. Per RN, pt having colonoscopy.   Per H&P, pt c/o abdominal pain/cramps w/ loose, non-bloody stools x 1 week and intermittent nausea (1-2 episodes per day) x3 days PTA. Pt reports 2 occurrences of similar symptoms earlier this year, but that they were less pronounced. Reported sx typically last ~3 days.   No weight history available for review.   PO Intake: 0% x 1 recorded meal   No UOP documented x24 hours I/O: +4928ml since admit  Medications and labs reviewed. K+ 3.4 (L) on 11/10. No updated labs today.   NUTRITION - FOCUSED PHYSICAL EXAM: Unable to perform at this time. Will attempt at follow-up.   Diet Order:   Diet Order             Diet NPO time specified  Diet effective midnight                   EDUCATION NEEDS:   No education needs have been identified at this time  Skin:  Skin Assessment: Reviewed RN Assessment  Last BM:  11/10  Height:   Ht Readings from Last 1 Encounters:  10/04/21 5\' 6"  (1.676 m)     Weight:   Wt Readings from Last 1 Encounters:  10/05/21 84.6 kg    BMI:  Body mass index is 30.1 kg/m.  Estimated Nutritional Needs:   Kcal:  1900-2100  Protein:  95-105 grams  Fluid:  >1.9L     Theone Stanley., MS, RD, LDN (she/her/hers) RD pager number and weekend/on-call pager number located in [place].

## 2021-10-06 NOTE — Anesthesia Preprocedure Evaluation (Addendum)
Anesthesia Evaluation  Patient identified by MRN, date of birth, ID band Patient awake    Reviewed: Allergy & Precautions, NPO status , Patient's Chart, lab work & pertinent test results  Airway Mallampati: II  TM Distance: >3 FB Neck ROM: Full    Dental no notable dental hx.    Pulmonary neg pulmonary ROS,    Pulmonary exam normal breath sounds clear to auscultation       Cardiovascular negative cardio ROS Normal cardiovascular exam Rhythm:Regular Rate:Normal     Neuro/Psych negative neurological ROS  negative psych ROS   GI/Hepatic negative GI ROS, Neg liver ROS,   Endo/Other  negative endocrine ROS  Renal/GU negative Renal ROS  negative genitourinary   Musculoskeletal negative musculoskeletal ROS (+)   Abdominal   Peds negative pediatric ROS (+)  Hematology negative hematology ROS (+)   Anesthesia Other Findings   Reproductive/Obstetrics negative OB ROS                             Anesthesia Physical Anesthesia Plan  ASA: 2  Anesthesia Plan: MAC   Post-op Pain Management:    Induction: Intravenous  PONV Risk Score and Plan: 2 and Propofol infusion, Treatment may vary due to age or medical condition and TIVA  Airway Management Planned: Natural Airway  Additional Equipment:   Intra-op Plan:   Post-operative Plan:   Informed Consent: I have reviewed the patients History and Physical, chart, labs and discussed the procedure including the risks, benefits and alternatives for the proposed anesthesia with the patient or authorized representative who has indicated his/her understanding and acceptance.     Dental advisory given  Plan Discussed with: CRNA and Anesthesiologist  Anesthesia Plan Comments:        Anesthesia Quick Evaluation

## 2021-10-06 NOTE — Progress Notes (Signed)
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  Acute appendicitis--however on overview with general surgery who saw the patient it seems that she had 2 prior attacks this year which may be IBD or Crohn's IV antibiotics started-GI consulted-patient kept on fluid diet   Hospital-Problem based course  Undifferentiated abdominal pain IBD versus appendicitis Continue Zosyn-- do not narrow until we have further from general surgery  Some nausea and could not complete all of the continue D5 LR 100 cc/H-follow fecal calprotectin GI pathogen panel Await further work-up from GI  DVT prophylaxis: SCD Code Status: Full Family Communication: None present Disposition:  Status is: Inpatient  Remains inpatient appropriate because: Not ready for discharge       Consultants:  Gastroenterology General surgery   Procedures: n  Antimicrobials: Zosyn   Subjective:  Some nausea and could not complete all bowel prep-overall seems improved from prior Stool has cleared up completely no blood showing mainly flecks of stool and mucousy in appearance at this time chest clear no added sound abdomen soft no rebound no guarding  Objective: Vitals:   10/05/21 1745 10/05/21 2136 10/05/21 2300 10/06/21 0557  BP: 129/81 125/88  117/74  Pulse: (!) 106 98  91  Resp: 18 16  16   Temp: 98.3 F (36.8 C) 98.7 F (37.1 C)  98.2 F (36.8 C)  TempSrc:  Oral  Oral  SpO2: 97% 93%  95%  Weight:   84.6 kg   Height:        Intake/Output Summary (Last 24 hours) at 10/06/2021 0821 Last data filed at 10/06/2021 0157 Gross per 24 hour  Intake 3215.7 ml   Output 0 ml  Net 3215.7 ml    Filed Weights   10/04/21 1537 10/04/21 2345 10/05/21 2300  Weight: 81.2 kg 83 kg 84.6 kg    Examination:  Awake coherent no distress EOMI NCAT no focal deficit S1-S2 no murmur CTA B no added sound abdomen soft no rebound no guarding no lower extremity edema  Data Reviewed: personally reviewed   CBC    Component Value Date/Time   WBC 6.4 10/05/2021 0541   RBC 3.95 10/05/2021 0541   HGB 11.5 (L) 10/05/2021 0541   HCT 34.7 (L) 10/05/2021 0541   PLT 224 10/05/2021 0541   MCV 87.8 10/05/2021 0541   MCH 29.1 10/05/2021 0541   MCHC 33.1 10/05/2021 0541   RDW 13.8 10/05/2021 0541   LYMPHSABS 1.1 10/05/2021 0541   MONOABS 1.0 10/05/2021 0541   EOSABS 0.3 10/05/2021 0541   BASOSABS 0.0 10/05/2021 0541   CMP Latest Ref Rng & Units 10/05/2021 10/04/2021  Glucose 70 - 99 mg/dL 117(H) 92  BUN 6 - 20 mg/dL 8 15  Creatinine 0.44 - 1.00 mg/dL 0.52 0.47  Sodium 135 - 145 mmol/L 135 136  Potassium 3.5 - 5.1 mmol/L 3.4(L) 3.8  Chloride 98 - 111 mmol/L 103 99  CO2 22 - 32 mmol/L 24 27  Calcium 8.9 - 10.3 mg/dL 8.5(L) 9.8  Total Protein 6.5 - 8.1 g/dL 5.8(L) 7.4  Total Bilirubin 0.3 - 1.2 mg/dL 0.8 0.5  Alkaline Phos 38 - 126 U/L 55 59  AST 15 - 41 U/L 14(L) 15  ALT 0 - 44 U/L 14 14     Radiology Studies: CT Abdomen Pelvis W Contrast  Result Date: 10/04/2021 CLINICAL DATA:  Abdominal distension, severe diffuse abdominal pain and gas like feelings, thinks she has an undiagnosed gluten sensitivity and had a donut Saturday night EXAM: CT ABDOMEN AND PELVIS WITH CONTRAST TECHNIQUE: Multidetector CT imaging of the abdomen and pelvis was performed using the standard protocol following bolus administration of intravenous contrast. CONTRAST:  139mL OMNIPAQUE IOHEXOL 300 MG/ML SOLN IV. No oral contrast. COMPARISON:  None FINDINGS: Lower chest: Lung bases clear Hepatobiliary: Gallbladder and liver normal appearance Pancreas: Normal appearance Spleen: Normal  appearance Adrenals/Urinary Tract: Adrenal glands, kidneys, ureters, and bladder normal appearance Stomach/Bowel: Appendix enlarged, 9 mm thick though mildly tapering distally. Extensive inflammatory process identified in the RIGHT pelvis with diffuse infiltration of mesenteric planes, thickening of cecum, periappendiceal infiltration, and thickening of distal small bowel loops. Associated free fluid in pelvis and mesentery. Dilated proximal small bowel loops some which show upper normal wall thickness. Colon decompressed. Stomach unremarkable. Ingested radiopacities within colon. Vascular/Lymphatic: Vascular structures patent. Aorta normal caliber. Scattered normal size mesenteric lymph nodes. Reproductive: Unremarkable uterus and adnexa Other: Free fluid in pelvis.  No free air.  No hernia or abscess. Musculoskeletal: No acute osseous findings. IMPRESSION: Extensive inflammatory process in the RIGHT pelvis with diffuse infiltration of mesenteric planes, thickening of cecum, and distal small bowel loops, associated with free fluid in pelvis and mesentery. The appendix is mildly enlarged and does demonstrate a degree of peri-appendiceal infiltration, though it is uncertain whether this is the epicenter of the process or secondarily involved. Findings favor acute appendicitis with associated peritonitis and wall thickening of small bowel loops in the pelvis versus less likely diffuse enteritis of the distal small bowel loops such as from inflammatory bowel disease or infectious etiology with associated regional inflammatory changes. Secondary ileus with dilatation of proximal small bowel. Free fluid without free air or definite abscess. Electronically Signed   By: Lavonia Dana M.D.   On: 10/04/2021 17:36     Scheduled Meds:   Continuous Infusions:  piperacillin-tazobactam 3.375 g (10/06/21 0157)     LOS: 2 days   Time spent: Par[place], MD [facility] To contact the attending  provider between 7A-7P or the covering provider during after hours 7P-7A, please log into the web site [email or web address] and access using universal [place] password for that web site. If you do not have the password, please call the hospital operator.  10/06/2021, 8:21 AM

## 2021-10-06 NOTE — Op Note (Signed)
[facility] Patient Name: Erin Houston Procedure Date : 10/06/2021 MRN: [id number] Attending MD: Gerrit Heck , MD Date of Birth: [birth year] CSN: [id number] Age: 50 Admit Type: Inpatient Procedure:                Colonoscopy Indications:              Abnormal CT of the GI tract, Diarrhea, Elevated                            ESR/CRP, Generalized/RLQ pain, Nausea Providers:                Gerrit Heck, MD, Glori Bickers, RN, Dulcy Fanny, [facility] Technician, Technician,                            Benetta Spar, Technician Referring MD:              Medicines:                Monitored Anesthesia Care Complications:            No immediate complications. Estimated Blood Loss:     Estimated blood loss was minimal. Procedure:                Pre-Anesthesia Assessment:                           - Prior to the procedure, a History and Physical                            was performed, and patient medications and                            allergies were reviewed. The patient's tolerance of                            previous anesthesia was also reviewed. The risks                            and benefits of the procedure and the sedation                            options and risks were discussed with the patient.                            All questions were answered, and informed consent                            was obtained. Prior Anticoagulants: The patient has                            taken no previous anticoagulant or antiplatelet                            agents.  ASA Grade Assessment: II - A patient with                            mild systemic disease. After reviewing the risks                            and benefits, the patient was deemed in                            satisfactory condition to undergo the procedure.                           After obtaining informed consent, the colonoscope                            was  passed under direct vision. Throughout the                            procedure, the patient's blood pressure, pulse, and                            oxygen saturations were monitored continuously. The                            [id number]-HQ190L ([id number] Olympus peds colonscope was                            introduced through the anus and advanced to the 10                            cm into the ileum. The colonoscopy was performed                            without difficulty. The patient tolerated the                            procedure well. The quality of the bowel                            preparation was good. The terminal ileum, ileocecal                            valve, appendiceal orifice, and rectum were                            photographed. Scope In: 12:54:10 PM Scope Out: 1:22:03 PM Scope Withdrawal Time: 0 hours 24 minutes 48 seconds  Total Procedure Duration: 0 hours 27 minutes 53 seconds  Findings:      Small hemorrhoids were found on perianal exam.      Two sessile polyps were found in the ascending colon. The polyps were 3       to 4 mm in size. These polyps were removed with a cold snare. Resection       and retrieval were complete. Estimated blood  loss was minimal.      Normal mucosa was found in the descending colon, in the transverse       colon, in the ascending colon, in the cecum, at the appendiceal orifice       and at the ileocecal valve. Biopsies were taken with a cold forceps for       histology. Estimated blood loss was minimal.      A localized area of mildly erythematous mucosa was found in the sigmoid       colon, located 15-25 cm from the anale verge. There were no ulcers,       erosions, or aphthae in this area. Biopsies were taken with a cold       forceps for histology. Estimated blood loss was minimal.      The retroflexed view of the distal rectum and anal verge was normal and       showed no anal or rectal abnormalities.      The terminal ileum  appeared normal. Colonoscope advanced approximately       10 cm into the ileum, without any pathology noted. Impression:               - Hemorrhoids found on perianal exam.                           - Two 3 to 4 mm polyps in the ascending colon,                            removed with a cold snare. Resected and retrieved.                           - Normal mucosa in the descending colon, in the                            transverse colon, in the ascending colon, in the                            cecum, at the appendiceal orifice and at the                            ileocecal valve. Biopsied.                           - Erythematous mucosa in the sigmoid colon.                            Biopsied.                           - The distal rectum and anal verge are normal on                            retroflexion view.                           - The examined portion of the ileum was normal. Recommendation:           - Return patient to hospital ward.                           -  Advance diet as tolerated.                           - Will discuss with primary Hospitalist service and                            consulting Surgical service about additional                            diagnostic options.                           - Continue present medications.                           - Await pathology results.                           - Repeat colonoscopy for surveillance based on                            pathology results.                           - Return to GI office at appointment to be                            scheduled. Procedure Code(s):        --- Professional ---                           [phone], Colonoscopy, flexible; with removal of                            tumor(s), polyp(s), or other lesion(s) by snare                            technique                           45380, 109, Colonoscopy, flexible; with biopsy,                            single or multiple Diagnosis  Code(s):        --- Professional ---                           K63.5, Polyp of colon                           K64.9, Unspecified hemorrhoids                           K63.89, Other specified diseases of intestine                           R19.7, Diarrhea, unspecified  R93.3, Abnormal findings on diagnostic imaging of                            other parts of digestive tract CPT copyright 2019 [employer]. All rights reserved. The codes documented in this report are preliminary and upon coder review may  be revised to meet current compliance requirements. Gerrit Heck, MD 10/06/2021 1:43:24 PM Number of Addenda: 0

## 2021-10-06 NOTE — Progress Notes (Addendum)
Progress Note     Subjective: Patient reports some cramping abdominal pain usually right before having BMs. She did have some nausea and vomiting with colonoscopy prep overnight. Afebrile. Plans for colonoscopy today.   Objective: Vital signs in last 24 hours: Temp:  [98.2 F (36.8 C)-98.7 F (37.1 C)] 98.2 F (36.8 C) (11/11 0557) Pulse Rate:  [91-106] 91 (11/11 0557) Resp:  [16-18] 16 (11/11 0557) BP: (117-129)/(74-88) 117/74 (11/11 0557) SpO2:  [93 %-97 %] 95 % (11/11 0557) Weight:  [84.6 kg] 84.6 kg (11/10 2300) Last BM Date: 10/05/21  Intake/Output from previous day: 11/10 0701 - 11/11 0700 In: 3215.7 [P.O.:2200; I.V.:865.3; IV Piggyback:150.4] Out: 0  Intake/Output this shift: No intake/output data recorded.  PE: General: pleasant, WD, WN female who is laying in bed in NAD Heart: regular, rate, and rhythm.   Lungs: Respiratory effort nonlabored Abd: soft, mild generalized ttp without peritonitis, ND, no masses, hernias, or organomegaly MS: all 4 extremities are symmetrical with no cyanosis, clubbing, or edema. Skin: warm and dry with no masses, lesions, or rashes Psych: A&Ox3 with an appropriate affect.    Lab Results:  Recent Labs    10/04/21 1554 10/05/21 0541  WBC 10.0 6.4  HGB 13.3 11.5*  HCT 39.3 34.7*  PLT 252 224   BMET Recent Labs    10/04/21 1554 10/05/21 0541  NA 136 135  K 3.8 3.4*  CL 99 103  CO2 27 24  GLUCOSE 92 117*  BUN 15 8  CREATININE 0.47 0.52  CALCIUM 9.8 8.5*   PT/INR No results for input(s): LABPROT, INR in the last 72 hours. CMP     Component Value Date/Time   NA 135 10/05/2021 0541   K 3.4 (L) 10/05/2021 0541   CL 103 10/05/2021 0541   CO2 24 10/05/2021 0541   GLUCOSE 117 (H) 10/05/2021 0541   BUN 8 10/05/2021 0541   CREATININE 0.52 10/05/2021 0541   CALCIUM 8.5 (L) 10/05/2021 0541   PROT 5.8 (L) 10/05/2021 0541   ALBUMIN 2.8 (L) 10/05/2021 0541   AST 14 (L) 10/05/2021 0541   ALT 14 10/05/2021 0541    ALKPHOS 55 10/05/2021 0541   BILITOT 0.8 10/05/2021 0541   GFRNONAA >60 10/05/2021 0541   Lipase     Component Value Date/Time   LIPASE <10 (L) 10/04/2021 1554       Studies/Results: CT Abdomen Pelvis W Contrast  Result Date: 10/04/2021 CLINICAL DATA:  Abdominal distension, severe diffuse abdominal pain and gas like feelings, thinks she has an undiagnosed gluten sensitivity and had a donut Saturday night EXAM: CT ABDOMEN AND PELVIS WITH CONTRAST TECHNIQUE: Multidetector CT imaging of the abdomen and pelvis was performed using the standard protocol following bolus administration of intravenous contrast. CONTRAST:  149mL OMNIPAQUE IOHEXOL 300 MG/ML SOLN IV. No oral contrast. COMPARISON:  None FINDINGS: Lower chest: Lung bases clear Hepatobiliary: Gallbladder and liver normal appearance Pancreas: Normal appearance Spleen: Normal appearance Adrenals/Urinary Tract: Adrenal glands, kidneys, ureters, and bladder normal appearance Stomach/Bowel: Appendix enlarged, 9 mm thick though mildly tapering distally. Extensive inflammatory process identified in the RIGHT pelvis with diffuse infiltration of mesenteric planes, thickening of cecum, periappendiceal infiltration, and thickening of distal small bowel loops. Associated free fluid in pelvis and mesentery. Dilated proximal small bowel loops some which show upper normal wall thickness. Colon decompressed. Stomach unremarkable. Ingested radiopacities within colon. Vascular/Lymphatic: Vascular structures patent. Aorta normal caliber. Scattered normal size mesenteric lymph nodes. Reproductive: Unremarkable uterus and adnexa Other: Free fluid in pelvis.  No free air.  No hernia or abscess. Musculoskeletal: No acute osseous findings. IMPRESSION: Extensive inflammatory process in the RIGHT pelvis with diffuse infiltration of mesenteric planes, thickening of cecum, and distal small bowel loops, associated with free fluid in pelvis and mesentery. The appendix is mildly  enlarged and does demonstrate a degree of peri-appendiceal infiltration, though it is uncertain whether this is the epicenter of the process or secondarily involved. Findings favor acute appendicitis with associated peritonitis and wall thickening of small bowel loops in the pelvis versus less likely diffuse enteritis of the distal small bowel loops such as from inflammatory bowel disease or infectious etiology with associated regional inflammatory changes. Secondary ileus with dilatation of proximal small bowel. Free fluid without free air or definite abscess. Electronically Signed   By: Lavonia Dana M.D.   On: 10/04/2021 17:36    Anti-infectives: Anti-infectives (From admission, onward)    Start     Dose/Rate Route Frequency Ordered Stop   10/05/21 0200  piperacillin-tazobactam (ZOSYN) IVPB 3.375 g        3.375 g 12.5 mL/hr over 240 Minutes Intravenous Every 8 hours 10/04/21 2210     10/04/21 1800  piperacillin-tazobactam (ZOSYN) IVPB 3.375 g        3.375 g 100 mL/hr over 30 Minutes Intravenous  Once 10/04/21 1751 10/04/21 1839        Assessment/Plan Abdominal pain  Inflammatory process in RLQ - imaging findings suspicious for Crohn's disease, pt also reports exposure to gluten right before symptoms started  - CRP elevated at 15.6 - afebrile, no leukocytosis - on IV zosyn - GI planning colonscopy - no indication for emergent surgical intervention, but we will follow up results of colonoscopy    FEN: NPO VTE: SCDs ID: Zosyn  LOS: 2 days    Norm Parcel, [facility] 10/06/2021, 8:59 AM Please see Amion for pager number during day hours 7:00am-4:30pm

## 2021-10-06 NOTE — Transfer of Care (Signed)
Immediate Anesthesia Transfer of Care Note  Patient: Erin Houston  Procedure(s) Performed: COLONOSCOPY WITH PROPOFOL POLYPECTOMY BIOPSY  Patient Location: Endo suite  Anesthesia Type:MAC  Level of Consciousness: drowsy  Airway & Oxygen Therapy: Patient Spontanous Breathing and Patient connected to nasal cannula oxygen  Post-op Assessment: Report given to RN and Post -op Vital signs reviewed and stable  Post vital signs: Reviewed and stable  Last Vitals:  Vitals Value Taken Time  BP 101/58 10/06/21 1327  Temp    Pulse 84 10/06/21 1328  Resp 21 10/06/21 1328  SpO2 98 % 10/06/21 1328  Vitals shown include unvalidated device data.  Last Pain:  Vitals:   10/06/21 1327  TempSrc: Temporal  PainSc:       Patients Stated Pain Goal: 0 (62/69/48 5462)  Complications: No notable events documented.

## 2021-10-06 NOTE — Discharge Summary (Signed)
Physician Discharge Summary  Erin Houston KKX:[id number] DOB: May 31, 1971 DOA: 10/04/2021  PCP: Nickola Major, MD  Admit date: 10/04/2021 Discharge date: 10/06/2021  Time spent: 33 minutes  Recommendations for Outpatient Follow-up:  Needs OP Ct abd pelvis ~ 4 weeks Soft diet for several days till d/c Follow pathology from colonoscopy performed 11/11, follow fecal calprotectin Will need cbc and cmet 1 week  Discharge Diagnoses:  MAIN problem for hospitalization   Colitis r/o Crohns  Please see below for itemized issues addressed in [place]- refer to other progress notes for clarity if needed  Discharge Condition: improved  Diet recommendation: reg  Filed Weights   10/04/21 1537 10/04/21 2345 10/05/21 2300  Weight: 81.2 kg 83 kg 84.6 kg    History of present illness:  50 year old white 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  Acute appendicitis--however on overview with general surgery who saw the patient it seems that she had 2 prior attacks this year which may be IBD or Crohn's IV antibiotics started-GI consulted-patient kept on fluid diet  Hospital Course:  Undifferentiated abdominal pain IBD versus appendicitis--was placed on IV ABX.  These were d/c after colonoscopy As OP follow fecal calprotectin  GI pathogen panel was neg Colonoscopy showed normal colon other than 2 polpy which were snared and random biopsies taken Patient was anxious to go home--she ate regular diet post procedure and was stable  She should follow up as OP for labs and imaging     Discharge Exam: Vitals:   10/06/21 1400 10/06/21 1422  BP: 120/74 121/74  Pulse: 89 80  Resp: 17   Temp:  97.6 F (36.4 C)  SpO2: 97% 98%    Subj on day of d/c   Alert well no  distress  General Exam on discharge  Eomi ncat no focal deficit S1s 2 no m Abd soft nt nd no rebound no RLQ tender Neuro intact  Discharge Instructions   Discharge Instructions     Diet - low sodium heart healthy   Complete by: As directed    Discharge instructions   Complete by: As directed    Please follow up with Dr. Bryan Lemma for further medical care and you will need to call his office if u have any severe abd pain, cramps nausea or vomiting   Increase activity slowly   Complete by: As directed       Allergies as of 10/06/2021   No Known Allergies      Medication List     STOP taking these medications    ibuprofen 200 MG tablet Commonly known as: ADVIL       TAKE these medications    ACID REDUCER PO Take 1 tablet by mouth daily as needed (stomach pain/swelling).   cholecalciferol 25 MCG (1000 UNIT) tablet Commonly known as: VITAMIN D Take 1,000 Units by mouth at bedtime.   fluticasone 50 MCG/ACT nasal spray Commonly known as: FLONASE Place 1 spray into the nose daily as needed for allergies.   loratadine 10 MG tablet Commonly known as: CLARITIN Take 10 mg by mouth daily as needed for allergies or rhinitis.   MAGNESIUM PO Take 1 tablet by mouth at bedtime. OTC   PHAZYME MAXIMUM STRENGTH PO Take 1 tablet by mouth 2 (two) times daily as needed (stomach pain).  No Known Allergies  Follow-up Information     Cirigliano, Vito V, DO. Go in 1 month(s).   Specialty: Gastroenterology Contact information: [address] [place] [address] [phone]                  The results of significant diagnostics from this hospitalization (including imaging, microbiology, ancillary and laboratory) are listed below for reference.    Significant Diagnostic Studies: CT Abdomen Pelvis W Contrast  Result Date: 10/04/2021 CLINICAL DATA:  Abdominal distension, severe diffuse abdominal pain and gas like feelings, thinks she  has an undiagnosed gluten sensitivity and had a donut Saturday night EXAM: CT ABDOMEN AND PELVIS WITH CONTRAST TECHNIQUE: Multidetector CT imaging of the abdomen and pelvis was performed using the standard protocol following bolus administration of intravenous contrast. CONTRAST:  147mL OMNIPAQUE IOHEXOL 300 MG/ML SOLN IV. No oral contrast. COMPARISON:  None FINDINGS: Lower chest: Lung bases clear Hepatobiliary: Gallbladder and liver normal appearance Pancreas: Normal appearance Spleen: Normal appearance Adrenals/Urinary Tract: Adrenal glands, kidneys, ureters, and bladder normal appearance Stomach/Bowel: Appendix enlarged, 9 mm thick though mildly tapering distally. Extensive inflammatory process identified in the RIGHT pelvis with diffuse infiltration of mesenteric planes, thickening of cecum, periappendiceal infiltration, and thickening of distal small bowel loops. Associated free fluid in pelvis and mesentery. Dilated proximal small bowel loops some which show upper normal wall thickness. Colon decompressed. Stomach unremarkable. Ingested radiopacities within colon. Vascular/Lymphatic: Vascular structures patent. Aorta normal caliber. Scattered normal size mesenteric lymph nodes. Reproductive: Unremarkable uterus and adnexa Other: Free fluid in pelvis.  No free air.  No hernia or abscess. Musculoskeletal: No acute osseous findings. IMPRESSION: Extensive inflammatory process in the RIGHT pelvis with diffuse infiltration of mesenteric planes, thickening of cecum, and distal small bowel loops, associated with free fluid in pelvis and mesentery. The appendix is mildly enlarged and does demonstrate a degree of peri-appendiceal infiltration, though it is uncertain whether this is the epicenter of the process or secondarily involved. Findings favor acute appendicitis with associated peritonitis and wall thickening of small bowel loops in the pelvis versus less likely diffuse enteritis of the distal small bowel loops  such as from inflammatory bowel disease or infectious etiology with associated regional inflammatory changes. Secondary ileus with dilatation of proximal small bowel. Free fluid without free air or definite abscess. Electronically Signed   By: Lavonia Dana M.D.   On: 10/04/2021 17:36    Microbiology: Recent Results (from the past 240 hour(s))  Resp Panel by RT-PCR (Flu A&B, Covid) Nasopharyngeal Swab     Status: None   Collection Time: 10/04/21  9:33 PM   Specimen: Nasopharyngeal Swab; Nasopharyngeal(NP) swabs in vial transport medium  Result Value Ref Range Status   SARS Coronavirus 2 by RT PCR NEGATIVE NEGATIVE Final    Comment: (NOTE) SARS-CoV-2 target nucleic acids are NOT DETECTED.  The SARS-CoV-2 RNA is generally detectable in upper respiratory specimens during the acute phase of infection. The lowest concentration of SARS-CoV-2 viral copies this assay can detect is 138 copies/mL. A negative result does not preclude SARS-Cov-2 infection and should not be used as the sole basis for treatment or other patient management decisions. A negative result may occur with  improper specimen collection/handling, submission of specimen other than nasopharyngeal swab, presence of viral mutation(s) within the areas targeted by this assay, and inadequate number of viral copies(<138 copies/mL). A negative result must be combined with clinical observations, patient history, and epidemiological information. The expected result is Negative.  Fact Sheet for Patients:  EntrepreneurPulse.com.au  Fact Sheet for Healthcare Providers:  IncredibleEmployment.be  This test is no t yet approved or cleared by the [place] FDA and  has been authorized for detection and/or diagnosis of SARS-CoV-2 by FDA under an Emergency Use Authorization (EUA). This EUA will remain  in effect (meaning this test can be used) for the duration of the COVID-19 declaration under Section  564(b)(1) of the Act, 21 U.S.C.section 360bbb-3(b)(1), unless the authorization is terminated  or revoked sooner.       Influenza A by PCR NEGATIVE NEGATIVE Final   Influenza B by PCR NEGATIVE NEGATIVE Final    Comment: (NOTE) The Xpert Xpress SARS-CoV-2/FLU/RSV plus assay is intended as an aid in the diagnosis of influenza from Nasopharyngeal swab specimens and should not be used as a sole basis for treatment. Nasal washings and aspirates are unacceptable for Xpert Xpress SARS-CoV-2/FLU/RSV testing.  Fact Sheet for Patients: EntrepreneurPulse.com.au  Fact Sheet for Healthcare Providers: IncredibleEmployment.be  This test is not yet approved or cleared by the [place] FDA and has been authorized for detection and/or diagnosis of SARS-CoV-2 by FDA under an Emergency Use Authorization (EUA). This EUA will remain in effect (meaning this test can be used) for the duration of the COVID-19 declaration under Section 564(b)(1) of the Act, 21 U.S.C. section 360bbb-3(b)(1), unless the authorization is terminated or revoked.  Performed at [facility], [address] [place], [place] [address]   Gastrointestinal Panel by PCR , Stool     Status: None   Collection Time: 10/06/21  5:33 AM   Specimen: Stool  Result Value Ref Range Status   Campylobacter species NOT DETECTED NOT DETECTED Final   Plesimonas shigelloides NOT DETECTED NOT DETECTED Final   Salmonella species NOT DETECTED NOT DETECTED Final   Yersinia enterocolitica NOT DETECTED NOT DETECTED Final   Vibrio species NOT DETECTED NOT DETECTED Final   Vibrio cholerae NOT DETECTED NOT DETECTED Final   Enteroaggregative E coli (EAEC) NOT DETECTED NOT DETECTED Final   Enteropathogenic E coli (EPEC) NOT DETECTED NOT DETECTED Final   Enterotoxigenic E coli (ETEC) NOT DETECTED NOT DETECTED Final   Shiga like toxin producing E coli (STEC) NOT DETECTED NOT DETECTED Final    Shigella/Enteroinvasive E coli (EIEC) NOT DETECTED NOT DETECTED Final   Cryptosporidium NOT DETECTED NOT DETECTED Final   Cyclospora cayetanensis NOT DETECTED NOT DETECTED Final   Entamoeba histolytica NOT DETECTED NOT DETECTED Final   Giardia lamblia NOT DETECTED NOT DETECTED Final   Adenovirus F40/41 NOT DETECTED NOT DETECTED Final   Astrovirus NOT DETECTED NOT DETECTED Final   Norovirus GI/GII NOT DETECTED NOT DETECTED Final   Rotavirus A NOT DETECTED NOT DETECTED Final   Sapovirus (I, II, IV, and V) NOT DETECTED NOT DETECTED Final    Comment: Performed at [facility], [address] [place], [place] [address]     Labs: Basic Metabolic Panel: Recent Labs  Lab 10/04/21 1554 10/05/21 0541  NA 136 135  K 3.8 3.4*  CL 99 103  CO2 27 24  GLUCOSE 92 117*  BUN 15 8  CREATININE 0.47 0.52  CALCIUM 9.8 8.5*   Liver Function Tests: Recent Labs  Lab 10/04/21 1554 10/05/21 0541  AST 15 14*  ALT 14 14  ALKPHOS 59 55  BILITOT 0.5 0.8  PROT 7.4 5.8*  ALBUMIN 3.9 2.8*   Recent Labs  Lab 10/04/21 1554  LIPASE <10*   No results for input(s): AMMONIA in the  last 168 hours. CBC: Recent Labs  Lab 10/04/21 1554 10/05/21 0541  WBC 10.0 6.4  NEUTROABS  --  4.0  HGB 13.3 11.5*  HCT 39.3 34.7*  MCV 85.6 87.8  PLT 252 224   Cardiac Enzymes: No results for input(s): CKTOTAL, CKMB, CKMBINDEX, TROPONINI in the last 168 hours. BNP: BNP (last 3 results) No results for input(s): BNP in the last 8760 hours.  ProBNP (last 3 results) No results for input(s): PROBNP in the last 8760 hours.  CBG: No results for input(s): GLUCAP in the last 168 hours.     Signed:  Nita Sells MD   [facility] 10/06/2021, 5:04 PM

## 2021-10-06 NOTE — Progress Notes (Signed)
Patient ID: Erin Houston, female   DOB: Sep 04, 1971, 50 y.o.   MRN: [id number] Discussed with patient at bedside with Dr. Bryan Lemma. No need for surgery at this time. Agree with his plan for F/U CT A/P in 4 weeks with F/U at [facility]. I will also discuss with my colorectal surgery partner, Dr. Dema Severin.  Georganna Skeans, MD, MPH, FACS Please use [email or web address] to contact on call provider

## 2021-10-09 ENCOUNTER — Encounter (HOSPITAL_COMMUNITY): Payer: Self-pay | Admitting: Gastroenterology

## 2021-10-09 ENCOUNTER — Telehealth: Payer: Self-pay | Admitting: General Surgery

## 2021-10-09 DIAGNOSIS — R1013 Epigastric pain: Secondary | ICD-10-CM

## 2021-10-09 LAB — CALPROTECTIN, FECAL: Calprotectin, Fecal: 142 ug/g — ABNORMAL HIGH (ref 0–120)

## 2021-10-09 LAB — SURGICAL PATHOLOGY

## 2021-10-09 NOTE — Telephone Encounter (Signed)
-----  Message from Lavena Bullion, DO sent at 10/06/2021  1:56 PM EST ----- Patient discharging today or tomorrow. Please coordinate the following:  - ESR/CRP in 7-10 days - CT abd/pelvis with PO contrast in 4 weeks - f/u with me in 4 weeks or so.  Thanks!

## 2021-10-09 NOTE — Telephone Encounter (Signed)
Left a detailed voicemail for the patient to contact the office. We need to schedule labs in 7-10 days and a ct abd pelvis with PO contrast and a f/u

## 2021-10-09 NOTE — Anesthesia Postprocedure Evaluation (Signed)
Anesthesia Post Note  Patient: Erin Houston  Procedure(s) Performed: COLONOSCOPY WITH PROPOFOL POLYPECTOMY BIOPSY     Patient location during evaluation: Endoscopy Anesthesia Type: MAC Level of consciousness: awake and alert Pain management: pain level controlled Vital Signs Assessment: post-procedure vital signs reviewed and stable Respiratory status: spontaneous breathing, nonlabored ventilation and respiratory function stable Cardiovascular status: blood pressure returned to baseline and stable Postop Assessment: no apparent nausea or vomiting Anesthetic complications: no   No notable events documented.  Last Vitals:  Vitals:   10/06/21 1400 10/06/21 1422  BP: 120/74 121/74  Pulse: 89 80  Resp: 17   Temp:  36.4 C  SpO2: 97% 98%    Last Pain:  Vitals:   10/06/21 1422  TempSrc: Oral  PainSc:    Pain Goal: Patients Stated Pain Goal: 0 (10/06/21 0900)                 Merlinda Frederick

## 2021-10-10 ENCOUNTER — Encounter: Payer: Self-pay | Admitting: Gastroenterology

## 2021-10-10 NOTE — Telephone Encounter (Signed)
Notified the patient of needing to have labs, CT and OV. Patient verbalized understanding, decided to have all done at [facility].

## 2021-10-10 NOTE — Addendum Note (Signed)
Addended by: Lanny Hurst A on: 10/10/2021 03:28 PM   Modules accepted: Orders

## 2021-10-12 ENCOUNTER — Telehealth (HOSPITAL_BASED_OUTPATIENT_CLINIC_OR_DEPARTMENT_OTHER): Payer: Self-pay

## 2021-10-13 ENCOUNTER — Encounter: Payer: Self-pay | Admitting: Gastroenterology

## 2021-10-16 ENCOUNTER — Other Ambulatory Visit: Payer: Self-pay

## 2021-10-16 ENCOUNTER — Other Ambulatory Visit (INDEPENDENT_AMBULATORY_CARE_PROVIDER_SITE_OTHER): Payer: BC Managed Care – PPO

## 2021-10-16 DIAGNOSIS — R1013 Epigastric pain: Secondary | ICD-10-CM

## 2021-10-17 LAB — C-REACTIVE PROTEIN: CRP: <1 mg/dL (ref 0.5–20.0)

## 2021-10-17 LAB — SEDIMENTATION RATE: Sed Rate: 30 mm/hr (ref 0–30)

## 2021-10-27 ENCOUNTER — Encounter: Payer: Self-pay | Admitting: Gastroenterology

## 2021-10-27 NOTE — Telephone Encounter (Signed)
Agree, still plan for CT as scheduled.  Thanks.

## 2021-11-08 ENCOUNTER — Telehealth (HOSPITAL_BASED_OUTPATIENT_CLINIC_OR_DEPARTMENT_OTHER): Payer: Self-pay

## 2021-11-13 ENCOUNTER — Other Ambulatory Visit: Payer: Self-pay

## 2021-11-13 ENCOUNTER — Ambulatory Visit (HOSPITAL_BASED_OUTPATIENT_CLINIC_OR_DEPARTMENT_OTHER)
Admission: RE | Admit: 2021-11-13 | Discharge: 2021-11-13 | Disposition: A | Discharge status: Home / Self Care | Payer: BC Managed Care – PPO | Source: Ambulatory Visit | Attending: Gastroenterology | Admitting: Gastroenterology

## 2021-11-13 DIAGNOSIS — R1013 Epigastric pain: Secondary | ICD-10-CM | POA: Insufficient documentation

## 2021-11-13 MED ORDER — IOHEXOL 300 MG/ML  SOLN
100.0000 mL | Freq: Once | INTRAMUSCULAR | Status: AC | PRN
  Administered 2021-11-13: 09:00:00 100 mL via INTRAVENOUS

## 2021-11-21 ENCOUNTER — Encounter: Payer: Self-pay | Admitting: Gastroenterology

## 2021-11-21 ENCOUNTER — Ambulatory Visit (INDEPENDENT_AMBULATORY_CARE_PROVIDER_SITE_OTHER): Payer: BC Managed Care – PPO | Admitting: Gastroenterology

## 2021-11-21 ENCOUNTER — Encounter (HOSPITAL_BASED_OUTPATIENT_CLINIC_OR_DEPARTMENT_OTHER): Payer: BC Managed Care – PPO

## 2021-11-21 ENCOUNTER — Other Ambulatory Visit: Payer: Self-pay

## 2021-11-21 VITALS — BP 140/84 | HR 77 | Ht 66.0 in | Wt 191.0 lb

## 2021-11-21 DIAGNOSIS — R933 Abnormal findings on diagnostic imaging of other parts of digestive tract: Secondary | ICD-10-CM | POA: Diagnosis not present

## 2021-11-21 DIAGNOSIS — R197 Diarrhea, unspecified: Secondary | ICD-10-CM | POA: Diagnosis not present

## 2021-11-21 DIAGNOSIS — Z1231 Encounter for screening mammogram for malignant neoplasm of breast: Secondary | ICD-10-CM

## 2021-11-21 DIAGNOSIS — Z86010 Personal history of colonic polyps: Secondary | ICD-10-CM

## 2021-11-21 NOTE — Progress Notes (Signed)
Chief Complaint:    Hospital follow-up  GI History: 50 year old female admitted 11/9-11 with crampy abdominal pain, 4 episodes of watery diarrhea x1 week.  Hospital admission notable for the following: - Normal CBC, CMP, lipase - ESR 49, CRP 15.6, fecal calprotectin 142 - Negative/normal GI PCR panel - CT abdomen/pelvis (10/04/2021):  Inflammatory changes in the right pelvis involving the cecum, periappendiceal area, and distal small bowel. - Colonoscopy (10/06/2021): 2 polyps in ascending colon (path: TA x1, HP x1). Localized area of mildly erythematous mucosa in the sigmoid colon located 15-25 cm from anal verge (path: Benign). Otherwise normal colonic mucosa (random biopsies normal).   Normal TI.  - 10/16/2021: Normal ESR and CRP - 11/13/2021: CT abdomen/pelvis: Normal with resolution of small bowel dilation and wall thickening  Family history notable for several family members with Celiac Disease.  No known family history of IBD or GI malignancy.  HPI:     Patient is a 50 y.o. female presenting to the Gastroenterology Clinic for hospital follow-up.  Was admitted in 09/2021 with abdominal pain and diarrhea with admission evaluation as outlined above.  Inflammatory markers subsequently normalized and repeat CT earlier this month with resolution of small bowel dilatation and thickening.  Today, she states she feels better and no acute issues.  Tolerating all p.o. intake without issue.  Bowel habits back to normal.  No complaints today.  Started taking probiotic recently.  Review of systems:     No chest pain, no SOB, no fevers, no urinary sx   History reviewed. No pertinent past medical history.  Patient's surgical history, family medical history, social history, medications and allergies were all reviewed in Epic    Current Outpatient Medications  Medication Sig Dispense Refill   cholecalciferol (VITAMIN D) 25 MCG (1000 UNIT) tablet Take 1,000 Units by mouth at bedtime.      fluticasone (FLONASE) 50 MCG/ACT nasal spray Place 1 spray into the nose daily as needed for allergies.     loratadine (CLARITIN) 10 MG tablet Take 10 mg by mouth daily as needed for allergies or rhinitis.     MAGNESIUM PO Take 1 tablet by mouth at bedtime. OTC     raNITIdine HCl (ACID REDUCER PO) Take 1 tablet by mouth daily as needed (stomach pain/swelling).     Simethicone (PHAZYME MAXIMUM STRENGTH PO) Take 1 tablet by mouth 2 (two) times daily as needed (stomach pain).     No current facility-administered medications for this visit.    Physical Exam:     BP 140/84    Pulse 77    Ht '5\' 6"'  (1.676 m)    Wt 191 lb (86.6 kg)    SpO2 98%    BMI 30.83 kg/m   GENERAL:  Pleasant female in NAD PSYCH: : Cooperative, normal affect Musculoskeletal:  Normal muscle tone, normal strength NEURO: Alert and oriented x 3, no focal neurologic deficits   IMPRESSION and PLAN:    1) Diarrhea-resolved 2) Abdominal pain-resolved 3) Abnormal CT scan-resolved 4) Elevated inflammatory markers-resolved - Complete resolution of GI symptoms with swift normalization of ESR/CRP.  Subsequent repeat CT earlier this month demonstrates resolution of acute inflammatory changes and normal-appearing GI tract.  Discussed possible etiology for her recent hospitalization.  Thankfully colonoscopy was otherwise largely unrevealing and symptoms have resolved with resolution of abnormal CT findings. - No further work-up needed at this time   6) History of colon polyps - Repeat colonoscopy in 2029 for ongoing polyp surveillance  RTC prn  I  spent 25 minutes of time, including independent review of results as outlined above, communicating results with the patient directly, face-to-face time with the patient, coordinating care, ordering studies and medications as appropriate, and documentation.             [place] ,DO, FACG 11/21/2021, 2:10 PM

## 2021-11-21 NOTE — Patient Instructions (Signed)
If you are age 51 or older, your body mass index should be between 23-30. Your Body mass index is 30.83 kg/m. If this is out of the aforementioned range listed, please consider follow up with your Primary Care Provider.  If you are age 64 or younger, your body mass index should be between 19-25. Your Body mass index is 30.83 kg/m. If this is out of the aformentioned range listed, please consider follow up with your Primary Care Provider.   ________________________________________________________  The [facility] providers would like to encourage you to use [facility] to communicate with providers for non-urgent requests or questions.  Due to long hold times on the telephone, sending your provider a message by [facility] may be a faster and more efficient way to get a response.  Please allow 48 business hours for a response.  Please remember that this is for non-urgent requests.  _______________________________________________________  Follow up as needed.   Please call with any questions or concerns.  It was a pleasure to see you today!  Vito Cirigliano, D.O.

## 2022-12-17 IMAGING — CT CT ABD-PELV W/ CM
2 of 5 series · 16 of 46 positions shown, 18 images · IV contrast (APPLIED)
Comparison: None

CLINICAL DATA: Abdominal distension, severe diffuse abdominal pain
and gas like feelings, thinks she has an undiagnosed gluten
sensitivity and had a donut [REDACTED] night

EXAM:
CT ABDOMEN AND PELVIS WITH CONTRAST
TECHNIQUE: Multidetector CT imaging of the abdomen and pelvis was performed
using the standard protocol following bolus administration of
intravenous contrast.
CONTRAST:  100mL OMNIPAQUE IOHEXOL 300 MG/ML SOLN IV. No oral
contrast.

[Series 2: abd pel w · axial · 0.78mm/px · z∈[-551,-151]mm · 13 of 90 slices shown, 15 images]
[im 5/90  soft-tissue]
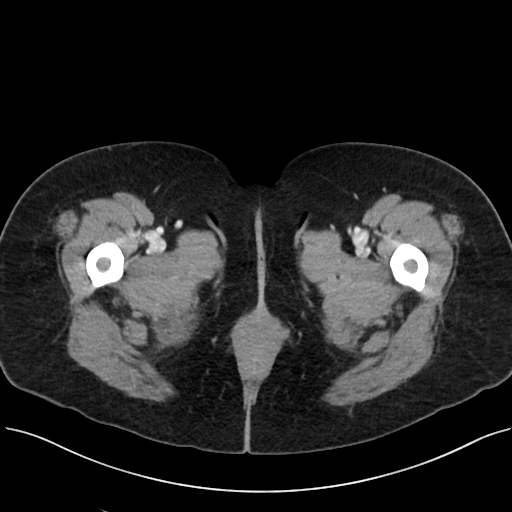
[im 5/90  bone]
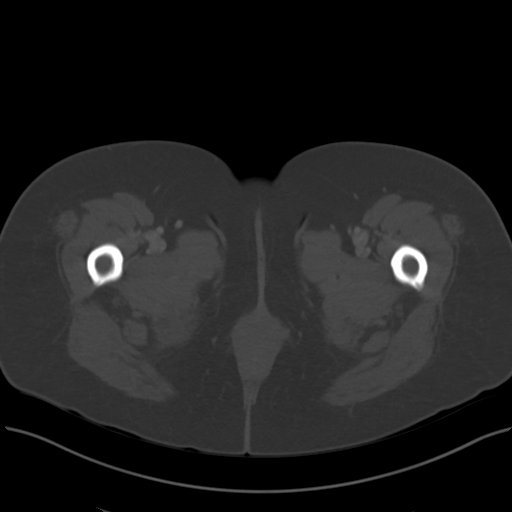
[im 15/90  soft-tissue]
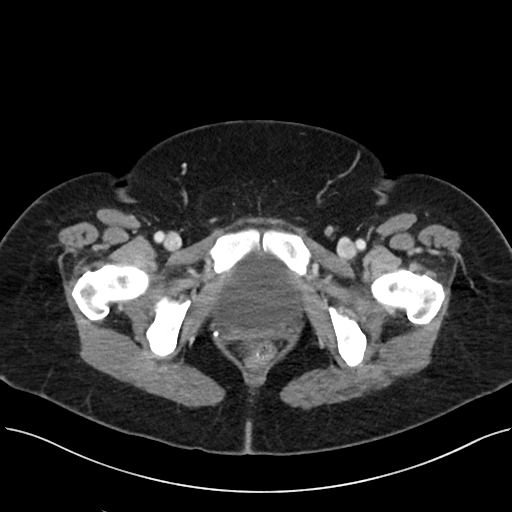
[im 19/90  soft-tissue]
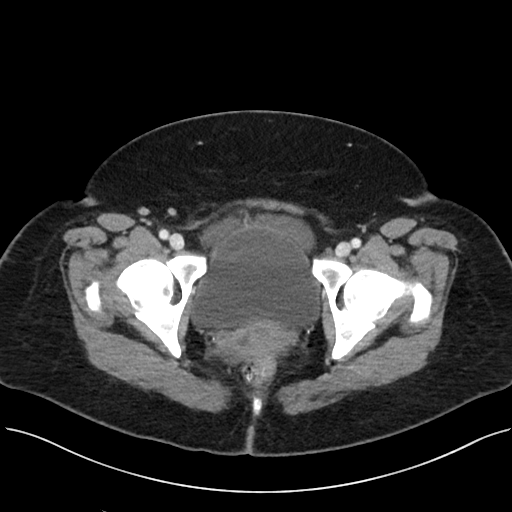
[im 24/90  soft-tissue]
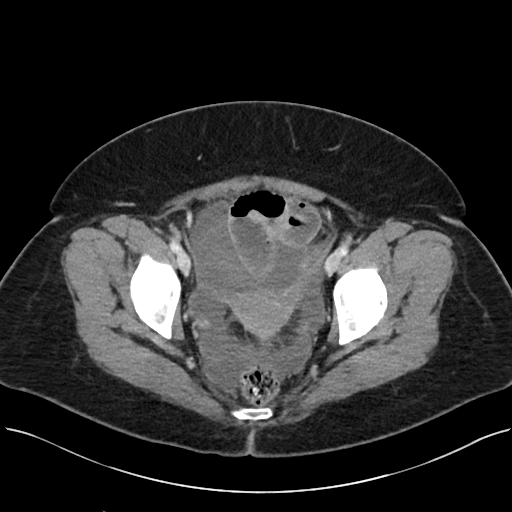
[im 33/90  soft-tissue]
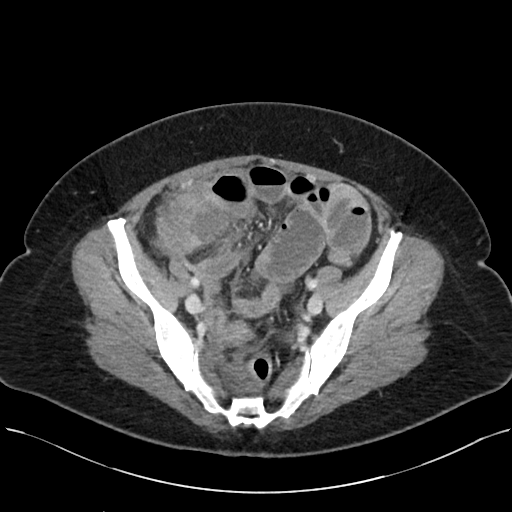
[im 38/90  soft-tissue]
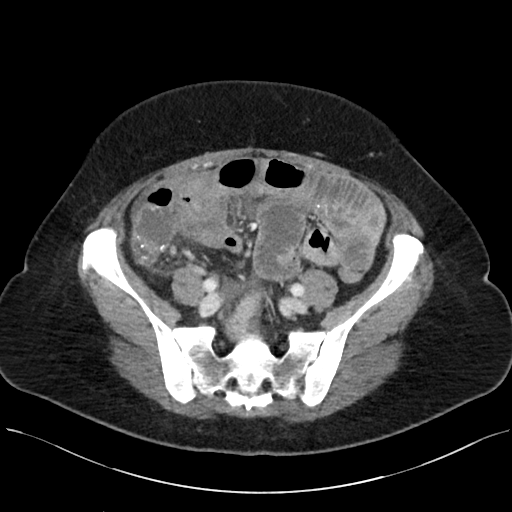
[im 47/90  soft-tissue]
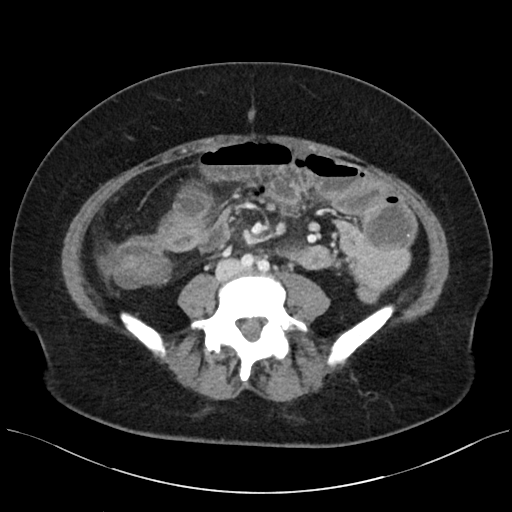
[im 52/90  soft-tissue]
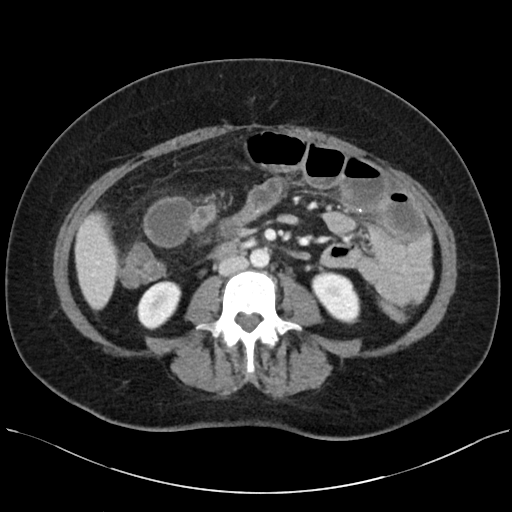
[im 57/90  soft-tissue]
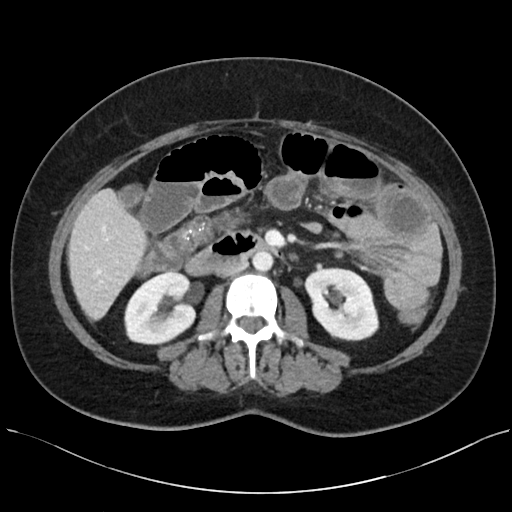
[im 57/90  bone]
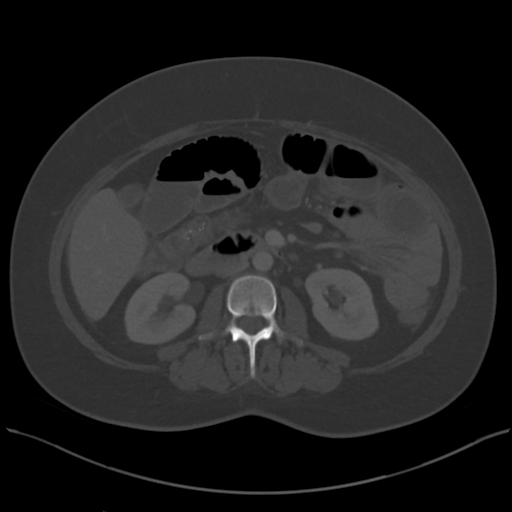
[im 66/90  soft-tissue]
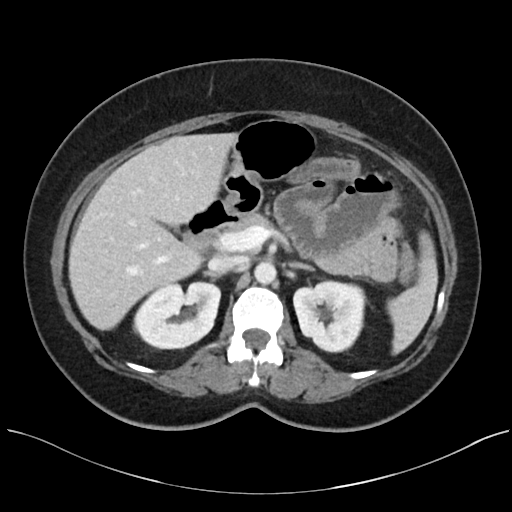
[im 71/90  soft-tissue]
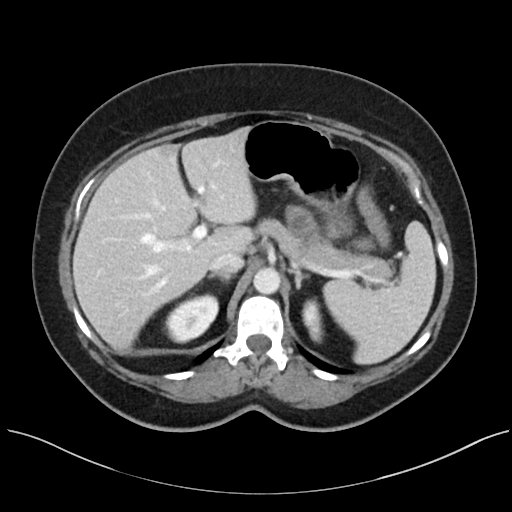
[im 75/90  soft-tissue]
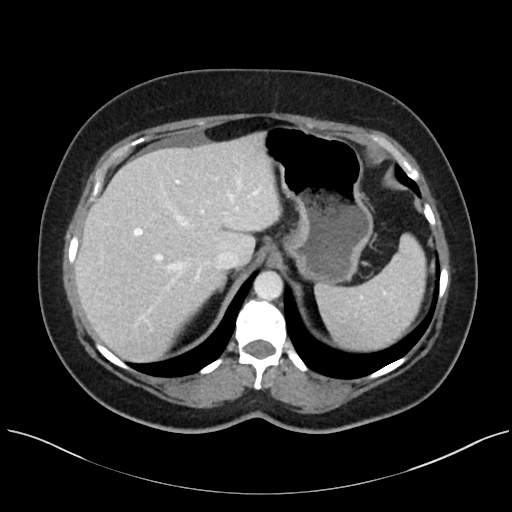
[im 85/90  soft-tissue]
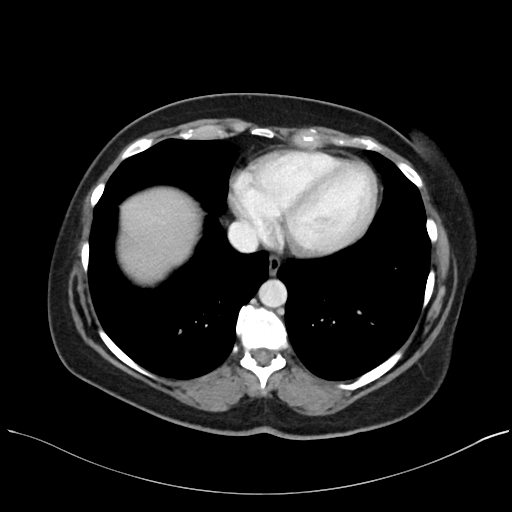

[Series 5: coronal · coronal · 0.85mm/px · 3 of 87 slices shown]
[im 29/87  soft-tissue]
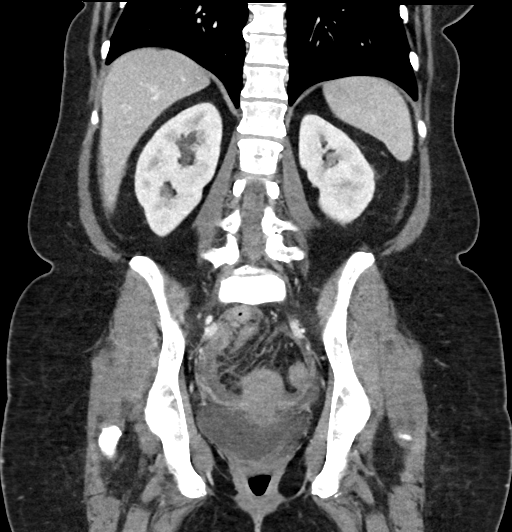
[im 39/87  soft-tissue]
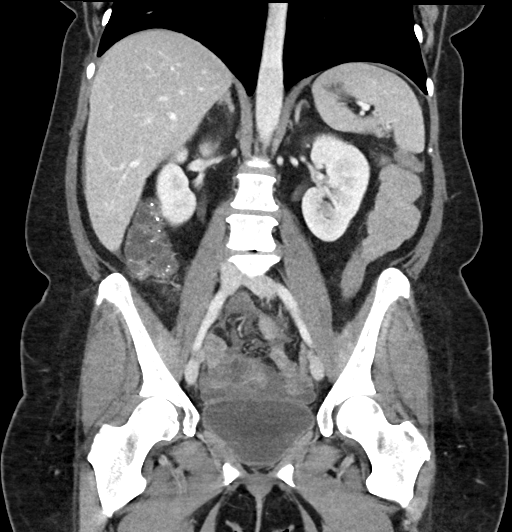
[im 48/87  soft-tissue]
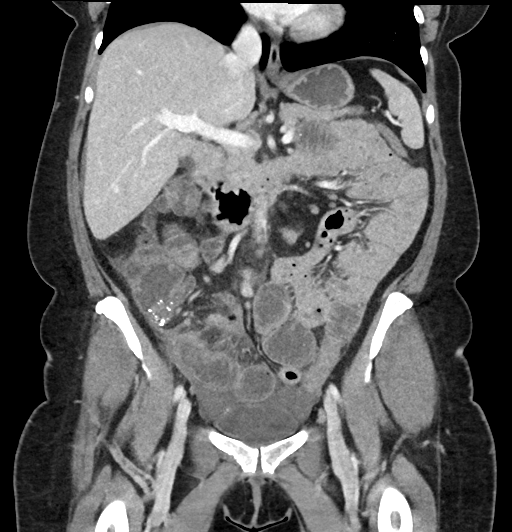

[16 of 46 positions shown; findings below may reference images not displayed]

FINDINGS: Lower chest: Lung bases clear

Hepatobiliary: Gallbladder and liver normal appearance

Pancreas: Normal appearance

Spleen: Normal appearance

Adrenals/Urinary Tract: Adrenal glands, kidneys, ureters, and
bladder normal appearance

Stomach/Bowel: Appendix enlarged, 9 mm thick though mildly tapering
distally. Extensive inflammatory process identified in the RIGHT
pelvis with diffuse infiltration of mesenteric planes, thickening of
cecum, periappendiceal infiltration, and thickening of distal small
bowel loops. Associated free fluid in pelvis and mesentery. Dilated
proximal small bowel loops some which show upper normal wall
thickness. Colon decompressed. Stomach unremarkable. Ingested
radiopacities within colon.

Vascular/Lymphatic: Vascular structures patent. Aorta normal
caliber. Scattered normal size mesenteric lymph nodes.

Reproductive: Unremarkable uterus and adnexa

Other: Free fluid in pelvis.  No free air.  No hernia or abscess.

Musculoskeletal: No acute osseous findings.
IMPRESSION: Extensive inflammatory process in the RIGHT pelvis with diffuse
infiltration of mesenteric planes, thickening of cecum, and distal
small bowel loops, associated with free fluid in pelvis and
mesentery.

The appendix is mildly enlarged and does demonstrate a degree of
peri-appendiceal infiltration, though it is uncertain whether this
is the epicenter of the process or secondarily involved.

Findings favor acute appendicitis with associated peritonitis and
wall thickening of small bowel loops in the pelvis versus less
likely diffuse enteritis of the distal small bowel loops such as
from inflammatory bowel disease or infectious etiology with
associated regional inflammatory changes.

Secondary ileus with dilatation of proximal small bowel.

Free fluid without free air or definite abscess.
